# Patient Record
Sex: Male | Born: 1990 | Hispanic: No | Marital: Married | Smoking: Former smoker
Health system: Southern US, Community
[De-identification: ages and names within clinical notes are randomized; demographics above are authoritative.]

## PROBLEM LIST (undated history)

## (undated) ENCOUNTER — Emergency Department (HOSPITAL_COMMUNITY): Discharge status: Absent without leave | Payer: Self-pay

## (undated) DIAGNOSIS — K219 Gastro-esophageal reflux disease without esophagitis: Secondary | ICD-10-CM

## (undated) DIAGNOSIS — R569 Unspecified convulsions: Secondary | ICD-10-CM

## (undated) HISTORY — DX: Gastro-esophageal reflux disease without esophagitis: K21.9

## (undated) HISTORY — PX: TONSILLECTOMY: SUR1361

## (undated) HISTORY — DX: Unspecified convulsions: R56.9

---
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
## 2011-01-03 LAB — CBC
HCT: 47.9 % (ref 36.0–49.0)
Hemoglobin: 16.4 g/dL — ABNORMAL HIGH (ref 12.0–16.0)
MCHC: 34.2 g/dL (ref 31.0–37.0)
MCV: 87.7 fL (ref 78.0–98.0)
Platelets: 195 10*3/uL (ref 150–400)
RBC: 5.47 MIL/uL (ref 3.80–5.70)
RDW: 12.6 % (ref 11.4–15.5)
WBC: 16 10*3/uL — ABNORMAL HIGH (ref 4.5–13.5)

## 2011-01-03 LAB — DIFFERENTIAL
Basophils Absolute: 0 10*3/uL (ref 0.0–0.1)
Basophils Relative: 0 % (ref 0–1)
Eosinophils Absolute: 0 10*3/uL (ref 0.0–1.2)
Eosinophils Relative: 0 % (ref 0–5)
Lymphocytes Relative: 6 % — ABNORMAL LOW (ref 24–48)
Lymphs Abs: 1 10*3/uL — ABNORMAL LOW (ref 1.1–4.8)
Monocytes Absolute: 0.6 10*3/uL (ref 0.2–1.2)
Monocytes Relative: 4 % (ref 3–11)
Neutro Abs: 14.3 10*3/uL — ABNORMAL HIGH (ref 1.7–8.0)
Neutrophils Relative %: 90 % — ABNORMAL HIGH (ref 43–71)

## 2011-01-03 LAB — COMPREHENSIVE METABOLIC PANEL
ALT: 20 U/L (ref 0–53)
AST: 29 U/L (ref 0–37)
Albumin: 4.1 g/dL (ref 3.5–5.2)
Alkaline Phosphatase: 59 U/L (ref 52–171)
BUN: 11 mg/dL (ref 6–23)
CO2: 26 mEq/L (ref 19–32)
Calcium: 9.1 mg/dL (ref 8.4–10.5)
Chloride: 102 mEq/L (ref 96–112)
Creatinine, Ser: 1.09 mg/dL (ref 0.4–1.5)
Glucose, Bld: 150 mg/dL — ABNORMAL HIGH (ref 70–99)
Potassium: 4.5 mEq/L (ref 3.5–5.1)
Sodium: 137 mEq/L (ref 135–145)
Total Bilirubin: 1.7 mg/dL — ABNORMAL HIGH (ref 0.3–1.2)
Total Protein: 7.4 g/dL (ref 6.0–8.3)

## 2011-01-03 LAB — RAPID URINE DRUG SCREEN, HOSP PERFORMED
Amphetamines: NOT DETECTED
Barbiturates: NOT DETECTED
Benzodiazepines: NOT DETECTED
Cocaine: NOT DETECTED
Opiates: NOT DETECTED
Tetrahydrocannabinol: POSITIVE — AB

## 2011-07-10 LAB — COMPREHENSIVE METABOLIC PANEL
ALT: 20
Albumin: 4.4
BUN: 8
Chloride: 99
Potassium: 4

## 2011-07-10 LAB — DIFFERENTIAL
Basophils Absolute: 0
Eosinophils Relative: 0
Lymphocytes Relative: 6 — ABNORMAL LOW
Lymphs Abs: 0.9 — ABNORMAL LOW
Monocytes Absolute: 0.9
Monocytes Relative: 6
Neutro Abs: 13.1 — ABNORMAL HIGH
Neutrophils Relative %: 88 — ABNORMAL HIGH

## 2011-07-10 LAB — RAPID URINE DRUG SCREEN, HOSP PERFORMED
Amphetamines: NOT DETECTED
Barbiturates: NOT DETECTED
Benzodiazepines: NOT DETECTED
Cocaine: NOT DETECTED
Opiates: NOT DETECTED

## 2011-07-10 LAB — URINALYSIS, ROUTINE W REFLEX MICROSCOPIC
Nitrite: NEGATIVE
Specific Gravity, Urine: 1.021
pH: 6

## 2011-07-10 LAB — CBC: Platelets: 252

## 2011-07-10 LAB — URINE MICROSCOPIC-ADD ON

## 2011-07-16 IMAGING — CR DG CHEST 1V PORT
1 series · 1 of 1 positions shown · non-contrast
Comparison: None

CLINICAL DATA: Seizure today.  Seizure 2 years ago.

PORTABLE CHEST - 1 VIEW

[AP]
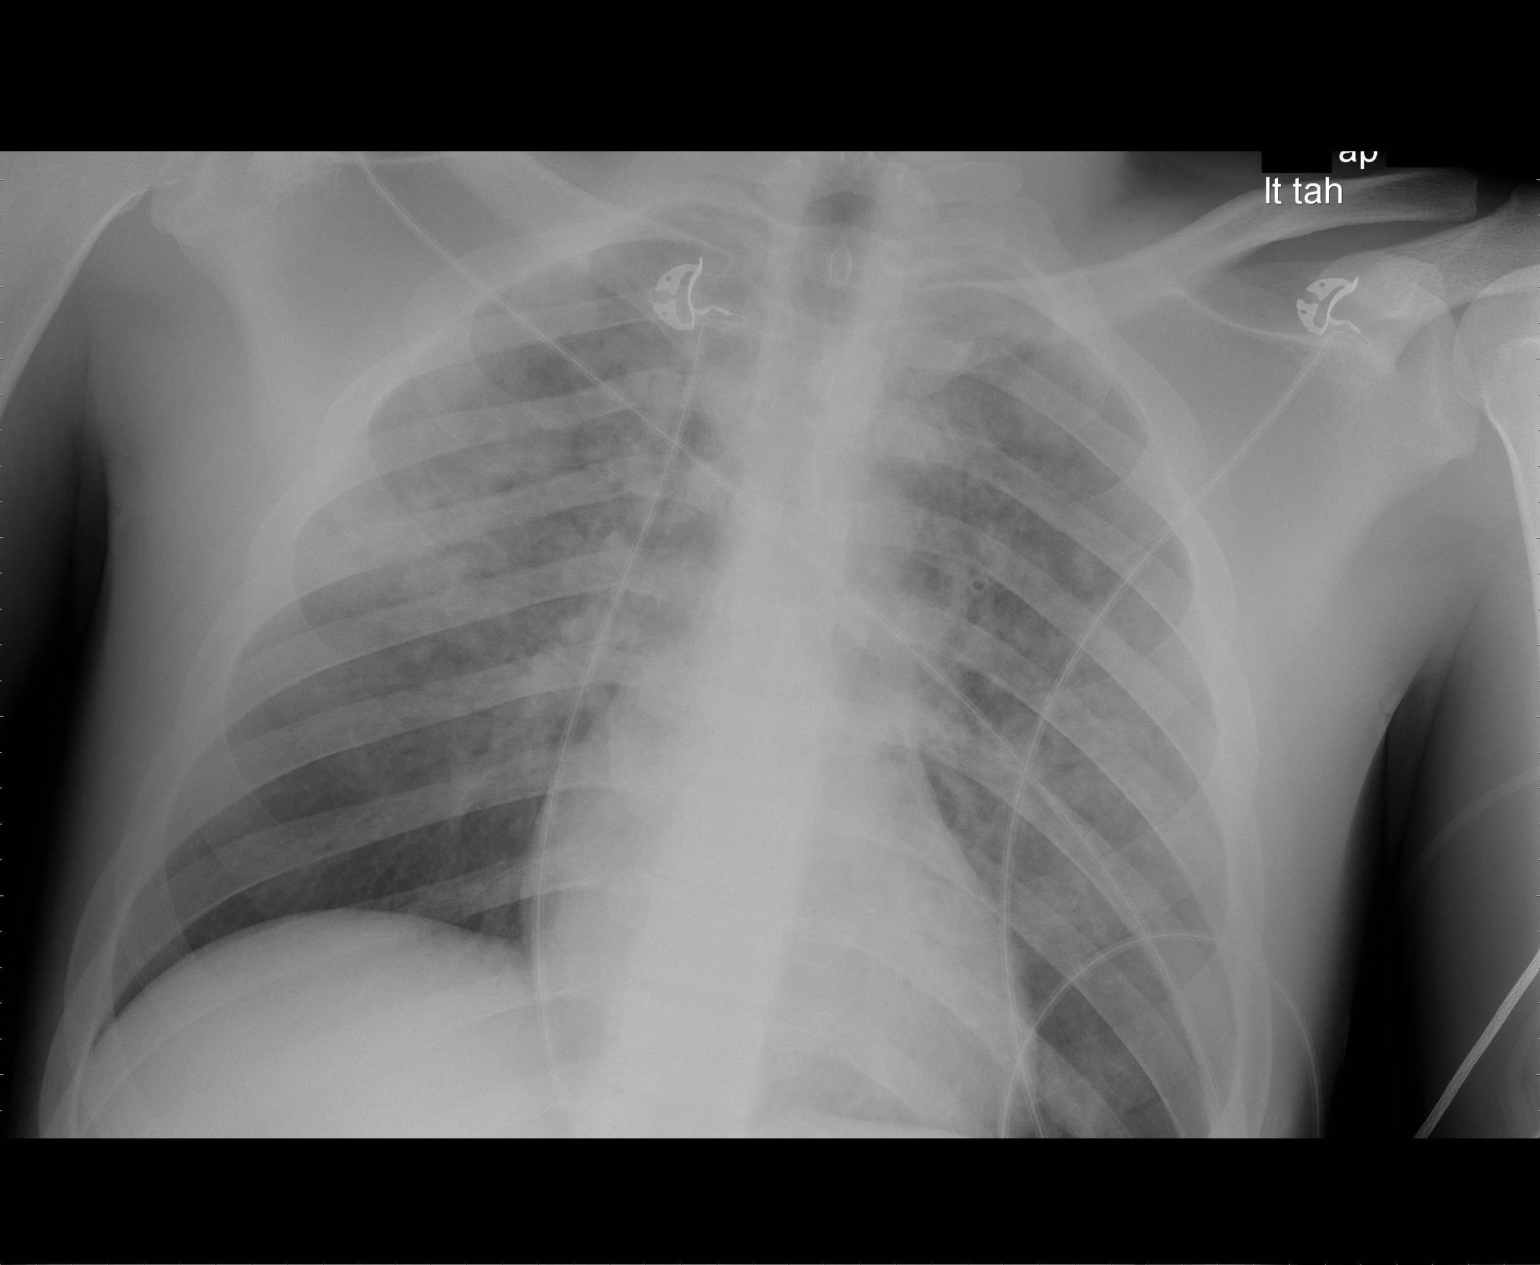

[1 of 1 positions shown; findings below may reference images not displayed]

FINDINGS: Heart size is normal.  There are patchy infiltrates
within the upper lobes bilaterally.  The findings are consistent
with aspiration pneumonia versus community-acquired pneumonia or
noncardiogenic edema.  The findings are atypical for cardiogenic
pulmonary edema.  No evidence for pleural effusion.
IMPRESSION: Bilateral upper lobe infiltrates.

## 2011-07-16 IMAGING — CT CT HEAD W/O CM
1 of 2 series · 14 of 30 positions shown, 18 images · non-contrast
Comparison: None

CLINICAL DATA: Seizure

CT HEAD WITHOUT CONTRAST
TECHNIQUE: Contiguous axial images were obtained from the base of
the skull through the vertex without contrast.

[Series 2: head routine 4.8 h37s · axial · 0.43mm/px · z∈[+1264,+1411]mm · 14 of 36 slices shown, 18 images]
[im 3/36  brain]
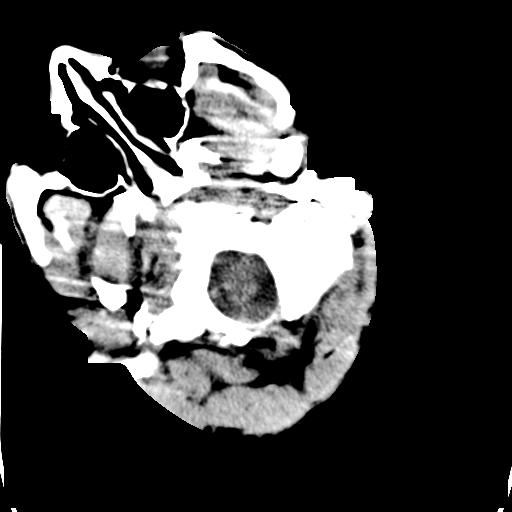
[im 3/36  bone]
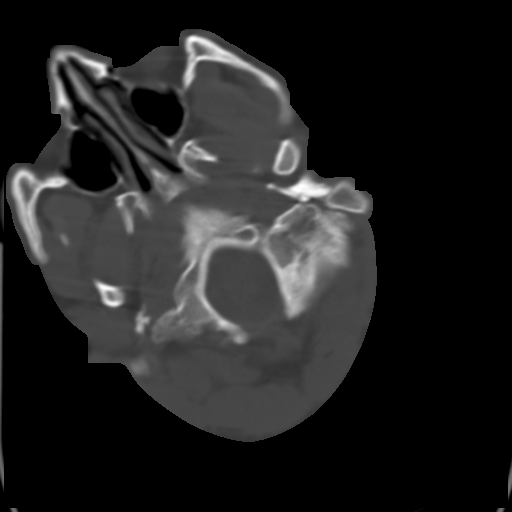
[im 5/36  brain]
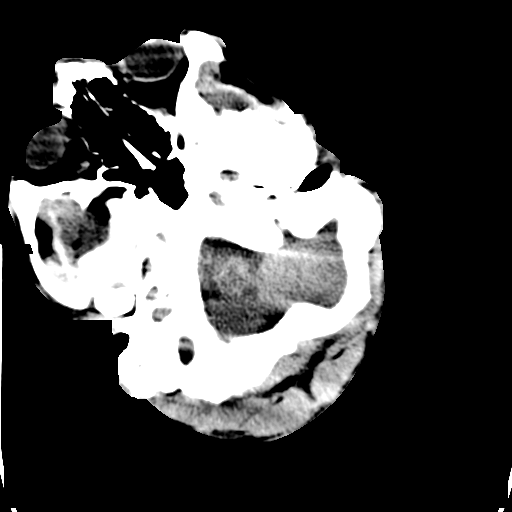
[im 8/36  brain]
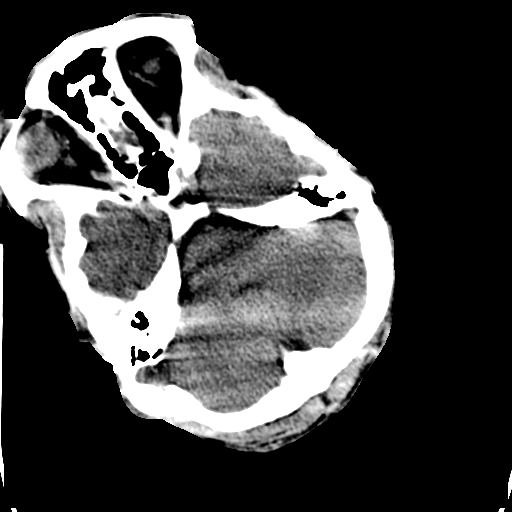
[im 10/36  brain]
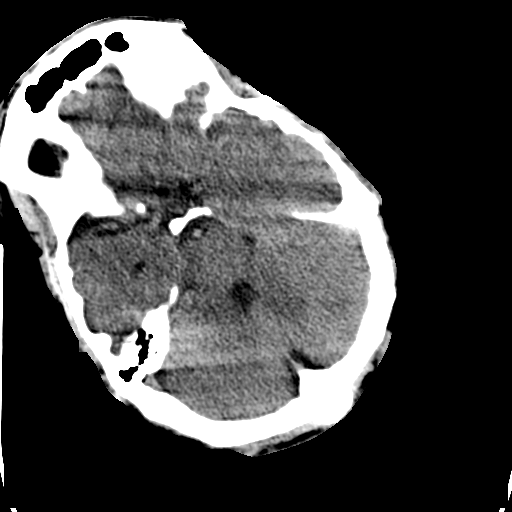
[im 12/36  brain]
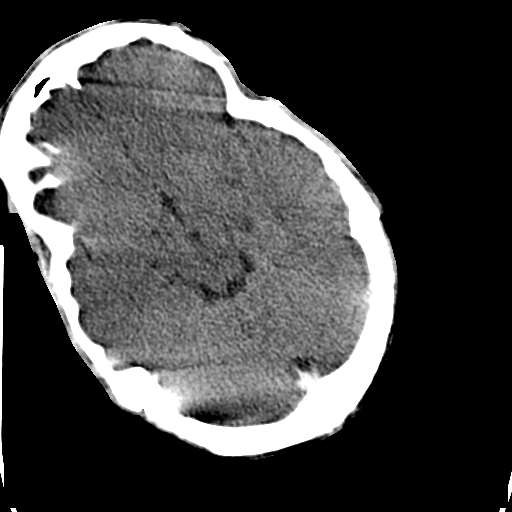
[im 12/36  bone]
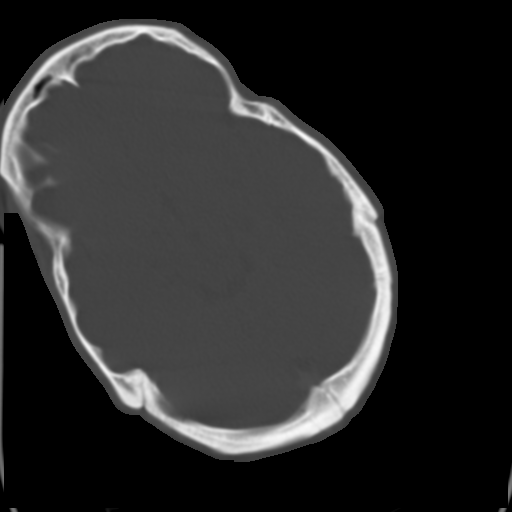
[im 15/36  brain]
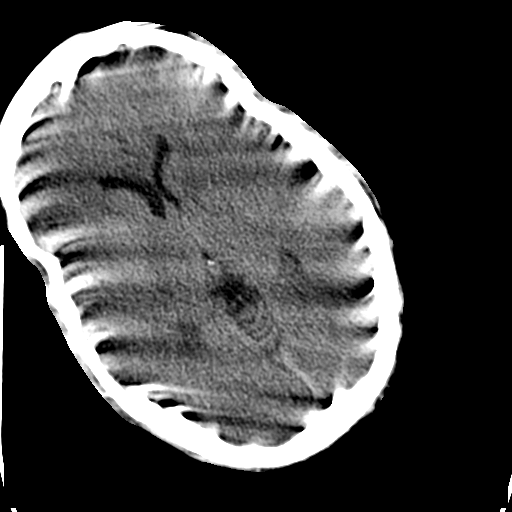
[im 17/36  brain]
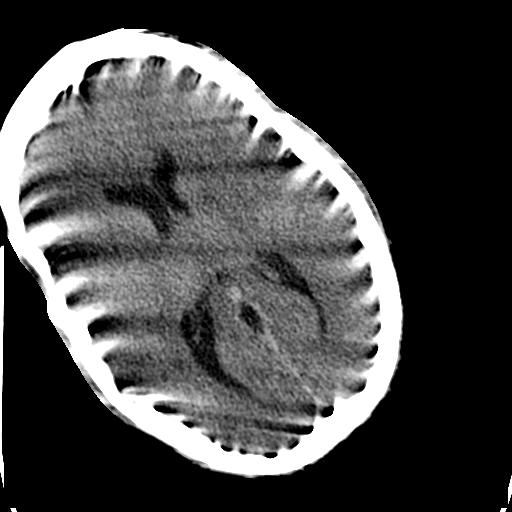
[im 19/36  brain]
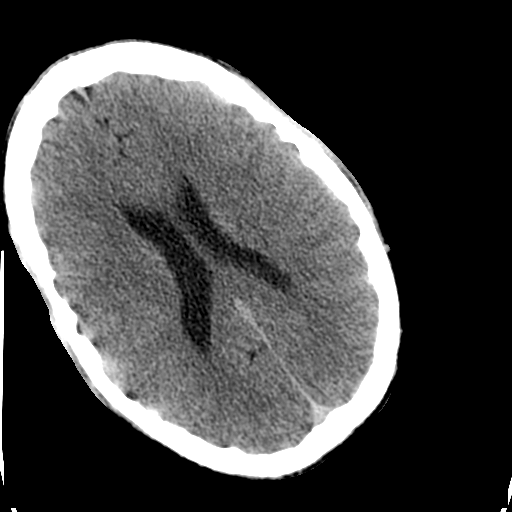
[im 22/36  brain]
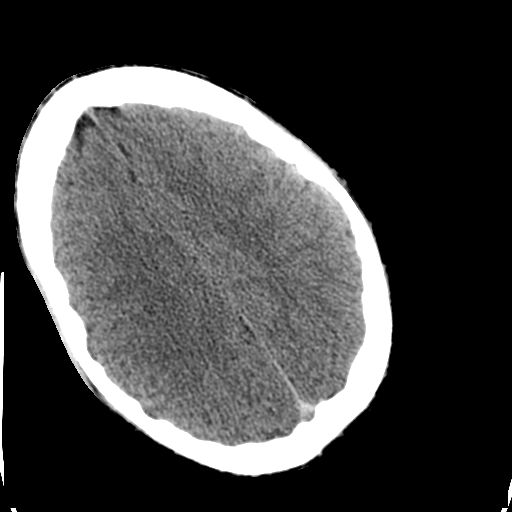
[im 22/36  bone]
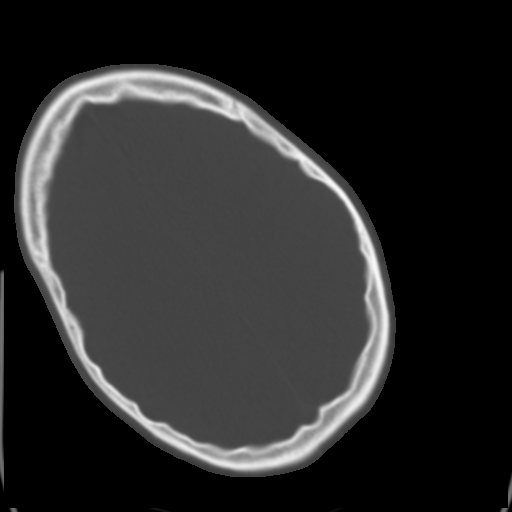
[im 24/36  brain]
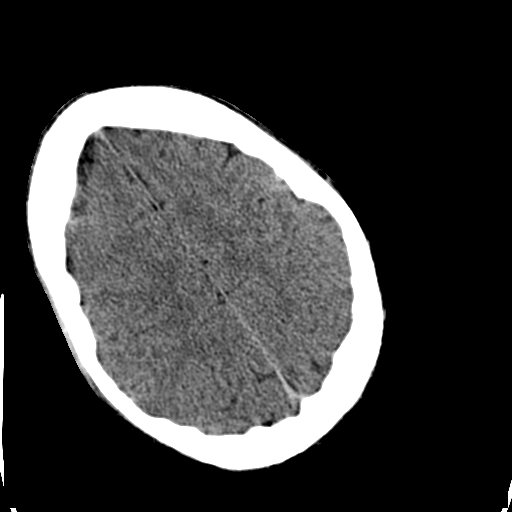
[im 26/36  brain]
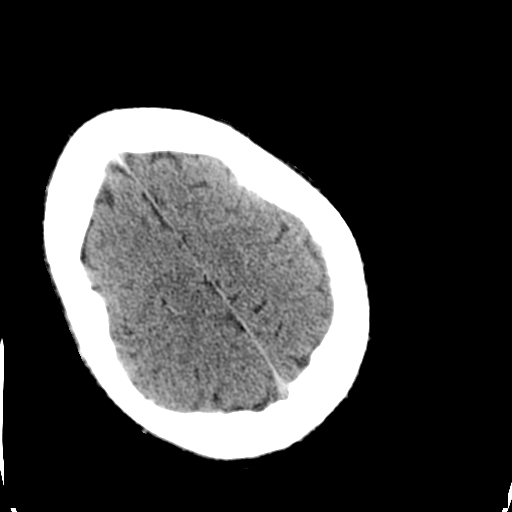
[im 29/36  brain]
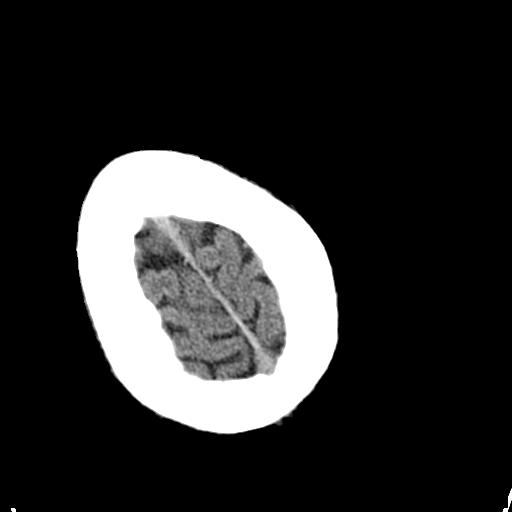
[im 31/36  brain]
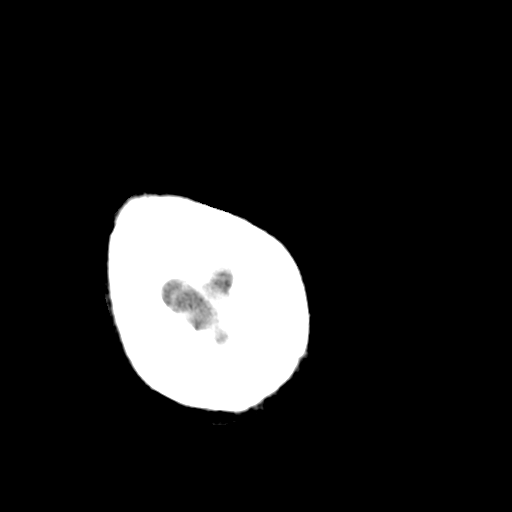
[im 31/36  bone]
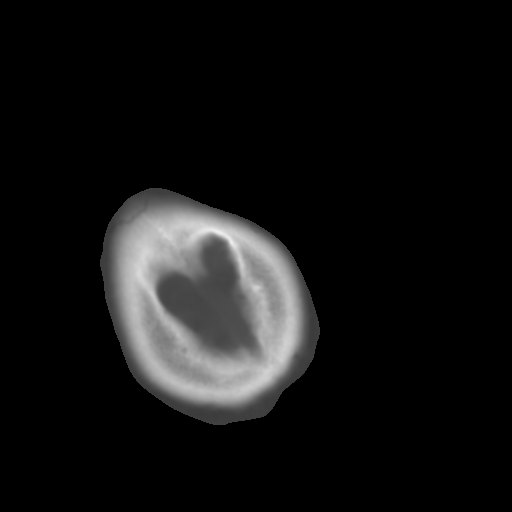
[im 33/36  brain]
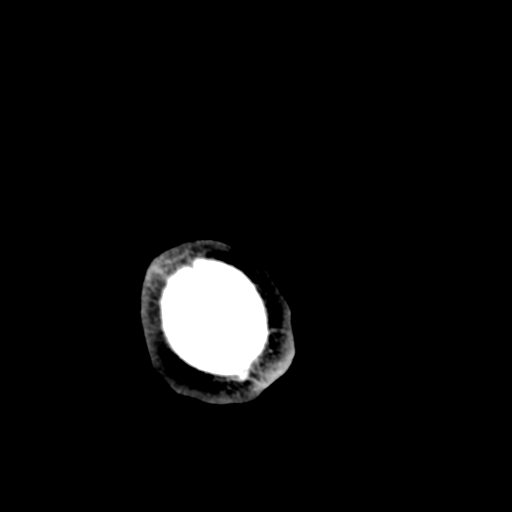

[14 of 30 positions shown; findings below may reference images not displayed]

FINDINGS: There is motion artifact that required repeating numerous
slices.

Ventricular size and CSF spaces unremarkable.  No evidence for
acute infarct, hemorrhage, or mass lesion. No extra-axial fluid
collections or midline shift.  Calvarium intact.  No fluid in the
sinuses visualized.
IMPRESSION: The exam is limited technically by motion artifact - there appears
to be no obvious acute or focal intracranial abnormality.

## 2012-01-24 DIAGNOSIS — G40209 Localization-related (focal) (partial) symptomatic epilepsy and epileptic syndromes with complex partial seizures, not intractable, without status epilepticus: Secondary | ICD-10-CM | POA: Insufficient documentation

## 2012-01-29 ENCOUNTER — Ambulatory Visit (INDEPENDENT_AMBULATORY_CARE_PROVIDER_SITE_OTHER): Payer: Self-pay | Admitting: Family Medicine

## 2012-01-29 ENCOUNTER — Encounter: Payer: Self-pay | Admitting: Family Medicine

## 2012-01-29 VITALS — BP 116/70 | HR 86 | Temp 98.3°F | Wt 191.0 lb

## 2012-01-29 DIAGNOSIS — S838X9A Sprain of other specified parts of unspecified knee, initial encounter: Secondary | ICD-10-CM

## 2012-01-29 DIAGNOSIS — S0031XA Abrasion of nose, initial encounter: Secondary | ICD-10-CM

## 2012-01-29 DIAGNOSIS — S86919A Strain of unspecified muscle(s) and tendon(s) at lower leg level, unspecified leg, initial encounter: Secondary | ICD-10-CM

## 2012-01-29 DIAGNOSIS — IMO0002 Reserved for concepts with insufficient information to code with codable children: Secondary | ICD-10-CM

## 2012-01-29 NOTE — Progress Notes (Signed)
  Subjective:    Patient ID: Joseph Cameron, male    DOB: 1991-05-18, 21 y.o.   MRN: [id number]  HPI Here to check his face after he fell 3 days ago on a paved street while playing football. He scraped the bridge of his nose, and he has been dressing it with Neosporin and a Bandaid daily since then. There is little pain now, and he can breathe through the nose easily. He also asks me to check his left knee. About a month ago he as playing basketball when another player ran into him and struck the knee from the side. It swelled and was painful for a few days after that, but then the swelling resolved. The pain is much better now, although it hurts if he flexes the knee completely, such as when squatting down. No locking or giving way.    Review of Systems  Constitutional: Negative.        Objective:   Physical Exam  Constitutional: He appears well-developed and well-nourished.  HENT:  Head: Normocephalic.  Right Ear: External ear normal.  Left Ear: External ear normal.  Mouth/Throat: Oropharynx is clear and moist.       The bridge of the nose has an abrasion that already has some granulation tissue coming in at the edges. No fractures are apparent  Eyes: Conjunctivae and EOM are normal. Pupils are equal, round, and reactive to light.  Neck: Normal range of motion. Neck supple.  Musculoskeletal:       The left knee has full ROM although he has some pain on full flexion. No edema. No tenderness or crepitus          Assessment & Plan:  The nose should heal just fine over the next few weeks. Use Neosporin daily. The knee may have a tiny tear in a meniscus, but for now we decided to take it easy and monitor it only. If he continues to have pain in the next few weeks, we may refer to Orthopedics. He will avoid playing basketball, running, or jumping on it.

## 2012-01-30 ENCOUNTER — Encounter: Payer: Self-pay | Admitting: Family Medicine

## 2012-05-20 ENCOUNTER — Telehealth: Payer: Self-pay | Admitting: Internal Medicine

## 2012-05-20 DIAGNOSIS — S0031XA Abrasion of nose, initial encounter: Secondary | ICD-10-CM

## 2012-05-20 NOTE — Telephone Encounter (Signed)
Called and spoke with pt - concerns about abrasion and how it is healing - nose injury while playing footbal - D.r Clent Ridges saw Please advise

## 2012-05-20 NOTE — Telephone Encounter (Signed)
Patient called stating that he would like to be referred to a Dermatologist for his nose injury. Please advise.

## 2012-05-21 NOTE — Telephone Encounter (Signed)
Ok to refer.

## 2012-07-17 ENCOUNTER — Encounter: Payer: Self-pay | Admitting: Internal Medicine

## 2012-07-17 ENCOUNTER — Ambulatory Visit (INDEPENDENT_AMBULATORY_CARE_PROVIDER_SITE_OTHER): Payer: BC Managed Care – PPO | Admitting: Internal Medicine

## 2012-07-17 VITALS — BP 96/60 | Wt 196.0 lb

## 2012-07-17 DIAGNOSIS — Z23 Encounter for immunization: Secondary | ICD-10-CM

## 2012-07-17 DIAGNOSIS — S93409A Sprain of unspecified ligament of unspecified ankle, initial encounter: Secondary | ICD-10-CM

## 2012-07-17 DIAGNOSIS — S93402A Sprain of unspecified ligament of left ankle, initial encounter: Secondary | ICD-10-CM

## 2012-07-17 NOTE — Patient Instructions (Signed)
You  may move around, but avoid painful motions and activities.  Apply ice to the sore area for 15 to 20 minutes 3 or 4 times daily for the next two to 3 days.  Call or return to clinic prn if these symptoms worsen or fail to improve as anticipated.  

## 2012-07-17 NOTE — Progress Notes (Signed)
  Subjective:    Patient ID: Joseph Cameron, male    DOB: [birth year], 21 y.o.   MRN: [id number]  HPI  21 year old patient who sustained a sprain to his left lateral ankle while playing basketball yesterday. He has persistent pain and soft tissue swelling and has had a very difficult time with ambulation due to the pain.  Past Medical History  Diagnosis Date  . Seizures     History   Social History  . Marital Status: Single    Spouse Name: N/A    Number of Children: N/A  . Years of Education: N/A   Occupational History  . Not on file.   Social History Main Topics  . Smoking status: Never Smoker   . Smokeless tobacco: Never Used  . Alcohol Use: 0.5 oz/week    1 drink(s) per week  . Drug Use: No  . Sexually Active: Not on file   Other Topics Concern  . Not on file   Social History Narrative  . No narrative on file    Past Surgical History  Procedure Date  . Tonsillectomy     No family history on file.  No Known Allergies  Current Outpatient Prescriptions on File Prior to Visit  Medication Sig Dispense Refill  . lamoTRIgine (LAMICTAL) 200 MG tablet Take 200 mg by mouth daily.      Marland Kitchen lisdexamfetamine (VYVANSE) 70 MG capsule Take 70 mg by mouth every morning.        BP 96/60  Wt 196 lb (88.905 kg)       Review of Systems  Constitutional: Negative for fever, chills, appetite change and fatigue.  HENT: Negative for hearing loss, ear pain, congestion, sore throat, trouble swallowing, neck stiffness, dental problem, voice change and tinnitus.   Eyes: Negative for pain, discharge and visual disturbance.  Respiratory: Negative for cough, chest tightness, wheezing and stridor.   Cardiovascular: Negative for chest pain, palpitations and leg swelling.  Gastrointestinal: Negative for nausea, vomiting, abdominal pain, diarrhea, constipation, blood in stool and abdominal distention.  Genitourinary: Negative for urgency, hematuria, flank pain, discharge, difficulty  urinating and genital sores.  Musculoskeletal: Positive for joint swelling and gait problem. Negative for myalgias, back pain and arthralgias.  Skin: Negative for rash.  Neurological: Negative for dizziness, syncope, speech difficulty, weakness, numbness and headaches.  Hematological: Negative for adenopathy. Does not bruise/bleed easily.  Psychiatric/Behavioral: Negative for behavioral problems and dysphoric mood. The patient is not nervous/anxious.        Objective:   Physical Exam  Constitutional: He appears well-developed and well-nourished. No distress.  Musculoskeletal:       Considerable tenderness soft tissue swelling involving his left lateral ankle          Assessment & Plan:  Left lateral ankle strain. Will attempt to elevate and ice over the next 24 hours and gradually increase his limits to ambulation. Aleve twice daily recommended note for school dispense

## 2012-08-05 ENCOUNTER — Ambulatory Visit (INDEPENDENT_AMBULATORY_CARE_PROVIDER_SITE_OTHER): Payer: BC Managed Care – PPO | Admitting: Internal Medicine

## 2012-08-05 VITALS — BP 120/64 | HR 56 | Temp 98.0°F | Wt 205.0 lb

## 2012-08-05 DIAGNOSIS — K1379 Other lesions of oral mucosa: Secondary | ICD-10-CM | POA: Insufficient documentation

## 2012-08-05 DIAGNOSIS — R569 Unspecified convulsions: Secondary | ICD-10-CM

## 2012-08-05 DIAGNOSIS — J029 Acute pharyngitis, unspecified: Secondary | ICD-10-CM

## 2012-08-05 MED ORDER — CEFUROXIME AXETIL 500 MG PO TABS: 500.0000 mg | ORAL_TABLET | Freq: Two times a day (BID) | ORAL | Status: DC

## 2012-08-05 MED ORDER — CEFTRIAXONE SODIUM 250 MG IJ SOLR
250.0000 mg | Freq: Once | INTRAMUSCULAR | Status: AC
  Administered 2012-08-05: 250 mg via INTRAMUSCULAR

## 2012-08-05 NOTE — Progress Notes (Signed)
  Subjective:    Patient ID: Joseph Cameron, male    DOB: November 11, 1990, 21 y.o.   MRN: [id number]  HPI  21 year old male with history of seizure disorder complains of spitting up blood 2 or 3 times over the past one week. Patient reports seeing dark red blood. He has had a sore throat for the last 2 weeks. He also has right ear discomfort. He denies fever or chills. He denies any cough or shortness of breath. He denies any abdominal discomfort. There is no report of nausea or vomiting. He denies epistaxis.  Review of Systems See HPI    Past Medical History  Diagnosis Date  . Seizures     History   Social History  . Marital Status: Single    Spouse Name: N/A    Number of Children: N/A  . Years of Education: N/A   Occupational History  . Not on file.   Social History Main Topics  . Smoking status: Never Smoker   . Smokeless tobacco: Never Used  . Alcohol Use: 0.5 oz/week    1 drink(s) per week  . Drug Use: No  . Sexually Active: Not on file   Other Topics Concern  . Not on file   Social History Narrative  . No narrative on file    Past Surgical History  Procedure Date  . Tonsillectomy     No family history on file.  No Known Allergies  Current Outpatient Prescriptions on File Prior to Visit  Medication Sig Dispense Refill  . lamoTRIgine (LAMICTAL) 200 MG tablet Take 200 mg by mouth daily.      Marland Kitchen lisdexamfetamine (VYVANSE) 70 MG capsule Take 70 mg by mouth every morning.        BP 120/64  Pulse 56  Temp 98 F (36.7 C) (Oral)  Wt 205 lb (92.987 kg)  SpO2 97%    Objective:   Physical Exam  Constitutional: He is oriented to person, place, and time. He appears well-developed and well-nourished.  HENT:  Head: Normocephalic and atraumatic.       Right tympanic membrane dull, erythematous and retracted Significant oropharyngeal erythema and tonsillar swelling Nasal turbinates are normal in appearance  Eyes: EOM are normal. Pupils are equal, round, and  reactive to light.  Neck: Neck supple.       Right anterior cervical adenopathy Mild neck tenderness  Cardiovascular: Normal rate, regular rhythm and normal heart sounds.   No murmur heard. Pulmonary/Chest: Effort normal and breath sounds normal. He has no wheezes. He has no rales.  Neurological: He is alert and oriented to person, place, and time. No cranial nerve deficit.  Psychiatric: He has a normal mood and affect.          Assessment & Plan:

## 2012-08-05 NOTE — Assessment & Plan Note (Signed)
21 year old has been spitting up blood to 3 times over the last one week. I suspect his symptoms are related to acute pharyngitis/tonsillitis. He may also have right otitis media. Patient given Rocephin 250 mg IM x1. Patient also advised to take 500 mg of cefuroxime twice daily for 10 days. Gargle with warm salt water 2-3 times a day. Follow up with Dr. Amador Cunas in 2 weeks.  Patient advised to call office if symptoms persist or worsen.

## 2012-08-05 NOTE — Assessment & Plan Note (Signed)
Stable on lamictal.  Patient's mother requests we monitor labs.  Arrange CBCD, LFTs and BMET.

## 2012-08-05 NOTE — Patient Instructions (Addendum)
Gargle with warm salt water 2-3 times per day Use salt water (saline) nose spray 3 - 4 times per day. Please call our office if your symptoms do not improve or gets worse. Follow up with Dr. Amador Cunas within 2 weeks Please complete the following lab test within 1 week BMET, CBCD, LFTs - 780.39

## 2013-02-28 ENCOUNTER — Telehealth: Payer: Self-pay | Admitting: *Deleted

## 2013-02-28 ENCOUNTER — Other Ambulatory Visit: Payer: Self-pay | Admitting: Internal Medicine

## 2013-02-28 DIAGNOSIS — G40219 Localization-related (focal) (partial) symptomatic epilepsy and epileptic syndromes with complex partial seizures, intractable, without status epilepticus: Secondary | ICD-10-CM

## 2013-02-28 NOTE — Telephone Encounter (Signed)
Left message on voicemail to call office. Needs to schedule  Lab appointment for blood work ordered by [employer].

## 2013-03-05 NOTE — Telephone Encounter (Signed)
Called pt he was not available his mother said but she would schedule appt for lab work. Transferred to scheduling.

## 2013-03-06 ENCOUNTER — Other Ambulatory Visit (INDEPENDENT_AMBULATORY_CARE_PROVIDER_SITE_OTHER): Payer: BC Managed Care – PPO

## 2013-03-06 DIAGNOSIS — G40219 Localization-related (focal) (partial) symptomatic epilepsy and epileptic syndromes with complex partial seizures, intractable, without status epilepticus: Secondary | ICD-10-CM

## 2013-03-07 ENCOUNTER — Other Ambulatory Visit: Payer: Self-pay | Admitting: Internal Medicine

## 2013-03-07 NOTE — Progress Notes (Signed)
Labs from 03/06/2013 faxed to [facility] Dr. Sherlean Foot as requested.

## 2013-04-07 ENCOUNTER — Encounter: Payer: Self-pay | Admitting: Internal Medicine

## 2013-04-07 ENCOUNTER — Ambulatory Visit (INDEPENDENT_AMBULATORY_CARE_PROVIDER_SITE_OTHER): Payer: BC Managed Care – PPO | Admitting: Internal Medicine

## 2013-04-07 VITALS — BP 120/72 | HR 69 | Temp 98.3°F | Wt 202.0 lb

## 2013-04-07 DIAGNOSIS — J039 Acute tonsillitis, unspecified: Secondary | ICD-10-CM | POA: Insufficient documentation

## 2013-04-07 DIAGNOSIS — J029 Acute pharyngitis, unspecified: Secondary | ICD-10-CM

## 2013-04-07 DIAGNOSIS — J020 Streptococcal pharyngitis: Secondary | ICD-10-CM

## 2013-04-07 HISTORY — DX: Acute tonsillitis, unspecified: J03.90

## 2013-04-07 LAB — POCT RAPID STREP A (OFFICE): Rapid Strep A Screen: NEGATIVE

## 2013-04-07 MED ORDER — CEPHALEXIN 500 MG PO CAPS: 500.0000 mg | ORAL_CAPSULE | Freq: Three times a day (TID) | ORAL | Status: DC

## 2013-04-07 NOTE — Progress Notes (Signed)
Chief Complaint  Patient presents with  . Sore Throat    Ongoing for 1 week.  . Cough  . Nasal Congestion  . Otalgia    HPI: Patient comes in  today for SDA for  new problem evaluation. Onset over a week ago.   No fever.  Has had sore throat to swallow and now  ear aches . 2 days ago of ear pain . Left .   No fever but dec appetite    Neg exposures . Works in [employer]  Had to leave work early today having headaches .   Uses smoking ocass ocass.  Cough not bad more related to  Drainage ? ROS: See pertinent positives and negatives per HPI. No new rash VD  On vyvanse ofr add .   No hx of mono  Had hx of r sided throat infection in fall  And rx w/ antibiotic   Past Medical History  Diagnosis Date  . Seizures     No family history on file.  History   Social History  . Marital Status: Single    Spouse Name: N/A    Number of Children: N/A  . Years of Education: N/A   Social History Main Topics  . Smoking status: Never Smoker   . Smokeless tobacco: Never Used  . Alcohol Use: 0.5 oz/week    1 drink(s) per week  . Drug Use: No  . Sexually Active: None   Other Topics Concern  . None   Social History Narrative  . None    Outpatient Encounter Prescriptions as of 04/07/2013  Medication Sig Dispense Refill  . lamoTRIgine (LAMICTAL) 200 MG tablet Take 200 mg by mouth daily.      Marland Kitchen lisdexamfetamine (VYVANSE) 70 MG capsule Take 70 mg by mouth every morning.      . cephALEXin (KEFLEX) 500 MG capsule Take 1 capsule (500 mg total) by mouth 3 (three) times daily.  30 capsule  0  . [DISCONTINUED] cefUROXime (CEFTIN) 500 MG tablet Take 1 tablet (500 mg total) by mouth 2 (two) times daily.  20 tablet  0   No facility-administered encounter medications on file as of 04/07/2013.    EXAM:  BP 120/72  Pulse 69  Temp(Src) 98.3 F (36.8 C) (Oral)  Wt 202 lb (91.627 kg)  BMI 25.6 kg/m2  SpO2 98%  Body mass index is 25.6 kg/(m^2).  GENERAL: vitals reviewed and listed above,  alert, oriented, appears well hydrated and in no acute distress sounds congestd throat  Full  No drooling and resp normal   HEENT: atraumatic, conjunctiva  clear, no obvious abnormalities on inspection of external nose and ears old scar on bridge of nose    tms intact  No acute findings translucent    Nares sounds congested no dc and no face tenderness  OP : patent  Tonsil 2 + early exudate  Ok airway no palatal petechia  Red 2 +  NECK: supple   Tender 1 + ac nodes  Tender  Shoddy pc nodes .  LUNGS: clear to auscultation bilaterally, no wheezes, rales or rhonchi, good air movement CV: HRRR, no clubbing cyanosis or  peripheral edema nl cap refill  Abdomen:  Sof,t normal bowel sounds without hepatosplenomegaly, no guarding rebound or masses no CVA tenderness MS: moves all extremities without noticeable focal  abnormality PSYCH: pleasant and cooperative, no obvious depression or anxiety  ASSESSMENT AND PLAN:  Discussed the following assessment and plan:  Acute tonsillitis - Plan: POCT Mono Gwyneth Sprout  Barr Virus)  rule out strep throat - Plan: POC Rapid Strep A  Sore throat - Plan: Throat culture Loney Loh), POCT Mono (Epstein Barr Virus) stil consdier mono with presentation  Vs other  Culture pending  Keflex 500 tid  Note for work fluids  Rests   Fu if not a lot better   In a few days or pen.  iof  persistent or progressive consider getting cbc serologies etc .  -Patient advised to return or notify health care team  if symptoms worsen or persist or new concerns arise.  Patient Instructions   Add antibiotic in case this is a bacterial tonsillitis. pending the results of the throat culture .  If  persistent or progressive   We can do more evaluation.  Expect improvement in the next 48 - 72 hours   Tonsillitis Tonsils are lumps of lymphoid tissues at the back of the throat. Each tonsil has 20 crevices (crypts). Tonsils help fight nose and throat infections and keep infection from spreading to  other parts of the body for the first 18 months of life. Tonsillitis is an infection of the throat that causes the tonsils to become red, tender, and swollen. CAUSES Sudden and, if treated, temporary (acute) tonsillitis is usually caused by infection with streptococcal bacteria. Long lasting (chronic) tonsillitis occurs when the crypts of the tonsils become filled with pieces of food and bacteria, which makes it easy for the tonsils to become constantly infected. SYMPTOMS  Symptoms of tonsillitis include:  A sore throat.  White patches on the tonsils.  Fever.  Tiredness. DIAGNOSIS Tonsillitis can be diagnosed through a physical exam. Diagnosis can be confirmed with the results of lab tests, including a throat culture. TREATMENT  The goals of tonsillitis treatment include the reduction of the severity and duration of symptoms, prevention of associated conditions, and prevention of disease transmission. Tonsillitis caused by bacteria can be treated with antibiotics. Usually, treatment with antibiotics is started before the cause of the tonsillitis is known. However, if it is determined that the cause is not bacterial, antibiotics will not treat the tonsillitis. If attacks of tonsillitis are severe and frequent, your caregiver may recommend surgery to remove the tonsils (tonsillectomy). HOME CARE INSTRUCTIONS   Rest as much as possible and get plenty of sleep.  Drink plenty of fluids. While the throat is very sore, eat soft foods or liquids, such as sherbet, soups, or instant breakfast drinks.  Eat frozen ice pops.  Older children and adults may gargle with a warm or cold liquid to help soothe the throat. Mix 1 teaspoon of salt in 1 cup of water.  Other family members who also develop a sore throat or fever should have a medical exam or throat culture.  Only take over-the-counter or prescription medicines for pain, discomfort, or fever as directed by your caregiver.  If you are given  antibiotics, take them as directed. Finish them even if you start to feel better. SEEK MEDICAL CARE IF:   Your baby is older than 3 months with a rectal temperature of 100.5 F (38.1 C) or higher for more than 1 day.  Large, tender lumps develop in your neck.  A rash develops.  Green, yellow-brown, or bloody substance is coughed up.  You are unable to swallow liquids or food for 24 hours.  Your child is unable to swallow food or liquids for 12 hours. SEEK IMMEDIATE MEDICAL CARE IF:   You develop any new symptoms such as vomiting, severe headache, stiff  neck, chest pain, or trouble breathing or swallowing.  You have severe throat pain along with drooling or voice changes.  You have severe pain, unrelieved with recommended medications.  You are unable to fully open the mouth.  You develop redness, swelling, or severe pain anywhere in the neck.  You have a fever.  Your baby is older than 3 months with a rectal temperature of 102 F (38.9 C) or higher.  Your baby is 52 months old or younger with a rectal temperature of 100.4 F (38 C) or higher. MAKE SURE YOU:   Understand these instructions.  Will watch your condition.  Will get help right away if you are not doing well or get worse. Document Released: 07/12/2005 Document Revised: 12/25/2011 Document Reviewed: 12/08/2010 [facility] [place] Patient Information 2014 [place], [place].      Neta Mends. Panosh M.D.

## 2013-04-07 NOTE — Patient Instructions (Addendum)
Add antibiotic in case this is a bacterial tonsillitis. pending the results of the throat culture .  If  persistent or progressive   We can do more evaluation.  Expect improvement in the next 48 - 72 hours   Tonsillitis Tonsils are lumps of lymphoid tissues at the back of the throat. Each tonsil has 20 crevices (crypts). Tonsils help fight nose and throat infections and keep infection from spreading to other parts of the body for the first 18 months of life. Tonsillitis is an infection of the throat that causes the tonsils to become red, tender, and swollen. CAUSES Sudden and, if treated, temporary (acute) tonsillitis is usually caused by infection with streptococcal bacteria. Long lasting (chronic) tonsillitis occurs when the crypts of the tonsils become filled with pieces of food and bacteria, which makes it easy for the tonsils to become constantly infected. SYMPTOMS  Symptoms of tonsillitis include:  A sore throat.  White patches on the tonsils.  Fever.  Tiredness. DIAGNOSIS Tonsillitis can be diagnosed through a physical exam. Diagnosis can be confirmed with the results of lab tests, including a throat culture. TREATMENT  The goals of tonsillitis treatment include the reduction of the severity and duration of symptoms, prevention of associated conditions, and prevention of disease transmission. Tonsillitis caused by bacteria can be treated with antibiotics. Usually, treatment with antibiotics is started before the cause of the tonsillitis is known. However, if it is determined that the cause is not bacterial, antibiotics will not treat the tonsillitis. If attacks of tonsillitis are severe and frequent, your caregiver may recommend surgery to remove the tonsils (tonsillectomy). HOME CARE INSTRUCTIONS   Rest as much as possible and get plenty of sleep.  Drink plenty of fluids. While the throat is very sore, eat soft foods or liquids, such as sherbet, soups, or instant breakfast  drinks.  Eat frozen ice pops.  Older children and adults may gargle with a warm or cold liquid to help soothe the throat. Mix 1 teaspoon of salt in 1 cup of water.  Other family members who also develop a sore throat or fever should have a medical exam or throat culture.  Only take over-the-counter or prescription medicines for pain, discomfort, or fever as directed by your caregiver.  If you are given antibiotics, take them as directed. Finish them even if you start to feel better. SEEK MEDICAL CARE IF:   Your baby is older than 3 months with a rectal temperature of 100.5 F (38.1 C) or higher for more than 1 day.  Large, tender lumps develop in your neck.  A rash develops.  Green, yellow-brown, or bloody substance is coughed up.  You are unable to swallow liquids or food for 24 hours.  Your child is unable to swallow food or liquids for 12 hours. SEEK IMMEDIATE MEDICAL CARE IF:   You develop any new symptoms such as vomiting, severe headache, stiff neck, chest pain, or trouble breathing or swallowing.  You have severe throat pain along with drooling or voice changes.  You have severe pain, unrelieved with recommended medications.  You are unable to fully open the mouth.  You develop redness, swelling, or severe pain anywhere in the neck.  You have a fever.  Your baby is older than 3 months with a rectal temperature of 102 F (38.9 C) or higher.  Your baby is 66 months old or younger with a rectal temperature of 100.4 F (38 C) or higher. MAKE SURE YOU:   Understand these instructions.  Will watch your condition.  Will get help right away if you are not doing well or get worse. Document Released: 07/12/2005 Document Revised: 12/25/2011 Document Reviewed: 12/08/2010 [facility] Patient Information 2014 [place], [place].

## 2013-04-09 LAB — CULTURE, GROUP A STREP

## 2014-10-13 ENCOUNTER — Telehealth: Payer: Self-pay | Admitting: Internal Medicine

## 2014-10-13 NOTE — Telephone Encounter (Signed)
Pt LMOVM for [facility] requesting injection records. I have printed them for pick up. He needs to sign a release when he picks them up. Unable to LM for patient b/c his VM is not set up.

## 2016-03-30 DIAGNOSIS — F909 Attention-deficit hyperactivity disorder, unspecified type: Secondary | ICD-10-CM | POA: Insufficient documentation

## 2019-12-21 ENCOUNTER — Ambulatory Visit: Payer: Self-pay

## 2022-07-31 ENCOUNTER — Emergency Department (HOSPITAL_COMMUNITY): Payer: 59

## 2022-07-31 ENCOUNTER — Encounter (HOSPITAL_COMMUNITY): Payer: Self-pay

## 2022-07-31 ENCOUNTER — Other Ambulatory Visit: Payer: Self-pay

## 2022-07-31 ENCOUNTER — Emergency Department (HOSPITAL_COMMUNITY)
Admission: EM | Admit: 2022-07-31 | Discharge: 2022-08-01 | Disposition: A | Discharge status: Home / Self Care | Payer: 59 | Attending: Physician Assistant | Admitting: Physician Assistant

## 2022-07-31 DIAGNOSIS — R569 Unspecified convulsions: Secondary | ICD-10-CM

## 2022-07-31 LAB — BASIC METABOLIC PANEL
Anion gap: 10 (ref 5–15)
BUN: 11 mg/dL (ref 6–20)
CO2: 25 mmol/L (ref 22–32)
Calcium: 9.6 mg/dL (ref 8.9–10.3)
Chloride: 101 mmol/L (ref 98–111)
Creatinine, Ser: 1.13 mg/dL (ref 0.61–1.24)
GFR, Estimated: >60 mL/min (ref >60)
Glucose, Bld: 97 mg/dL (ref 70–99)
Potassium: 3.7 mmol/L (ref 3.5–5.1)
Sodium: 136 mmol/L (ref 135–145)

## 2022-07-31 LAB — CBC
HCT: 48.6 % (ref 39.0–52.0)
Hemoglobin: 16.6 g/dL (ref 13.0–17.0)
MCH: 29.6 pg (ref 26.0–34.0)
MCHC: 34.2 g/dL (ref 30.0–36.0)
MCV: 86.6 fL (ref 80.0–100.0)
Platelets: 266 10*3/uL (ref 150–400)
RBC: 5.61 MIL/uL (ref 4.22–5.81)
RDW: 11.5 % (ref 11.5–15.5)
WBC: 17.3 10*3/uL — ABNORMAL HIGH (ref 4.0–10.5)
nRBC: 0 % (ref 0.0–0.2)

## 2022-07-31 LAB — ETHANOL: Alcohol, Ethyl (B): <10 mg/dL (ref <10)

## 2022-07-31 LAB — MAGNESIUM: Magnesium: 2.4 mg/dL (ref 1.7–2.4)

## 2022-07-31 NOTE — ED Provider Triage Note (Signed)
Emergency Medicine Provider Triage Evaluation Note  Joseph Cameron , a 30 y.o. male  was evaluated in triage.  Pt complains of seizure.  Patient reportedly had a seizure at work.  States that he fell from standing.  There was reported tongue biting, he was pale and diaphoretic and postictal he had urinary incontinence no evidence of oral trauma.  He has had a seizure history, states the last one was 2 years ago and he stopped taking medication about a year ago.  He currently complains of a 10 out of 10 headache, states that he did fall from standing.  Denies any blurry vision, nausea, vomiting.  Review of Systems  Positive: As above Negative: As above   Physical Exam  BP 115/80   Pulse 76   Temp 98.2 F (36.8 C) (Oral)   Resp 16   SpO2 100%  Gen:   Awake, no distress   Resp:  Normal effort  MSK:   Moves extremities without difficulty  Other:  Evidence of tongue biting, Mental Status:  Alert, thought content appropriate, able to give a coherent history. Speech fluent without evidence of aphasia. Able to follow 2 step commands without difficulty.  Cranial Nerves:  II:  Peripheral visual fields grossly normal, pupils equal, round, reactive to light III,IV, VI: ptosis not present, extra-ocular motions intact bilaterally  V,VII: smile symmetric, facial light touch sensation equal VIII: hearing grossly normal to voice  X: uvula elevates symmetrically  XI: bilateral shoulder shrug symmetric and strong XII: midline tongue extension without fassiculations Motor:  Normal tone. 5/5 strength of BUE and BLE major muscle groups including strong and equal grip strength and dorsiflexion/plantar flexion Sensory: light touch normal in all extremities. Cerebellar: normal finger-to-nose with bilateral upper extremities, Romberg sign absent    Medical Decision Making  Medically screening exam initiated at 10:41 PM.  Appropriate orders placed.  Joseph Cameron was informed that the  remainder of the evaluation will be completed by another provider, this initial triage assessment does not replace that evaluation, and the importance of remaining in the ED until their evaluation is complete.     Garald Balding, PA-C 07/31/22 [id number]

## 2022-07-31 NOTE — ED Triage Notes (Signed)
PER EMS: pt had a grand mal seizure tonight at work. EMS arrived to find him pale, diaphoretic and post ictal. + urinary incontinence and oral trauma. Hx of seizures, last one was 2 years ago. He stopped taking his seizure medication a year ago.   BP- 140/90, HR-82, 99% RA, CBG - 126 20g L hand

## 2022-08-01 LAB — RAPID URINE DRUG SCREEN, HOSP PERFORMED
Amphetamines: NOT DETECTED
Barbiturates: NOT DETECTED
Benzodiazepines: NOT DETECTED
Cocaine: NOT DETECTED
Opiates: NOT DETECTED
Tetrahydrocannabinol: POSITIVE — AB

## 2022-08-01 MED ORDER — LAMOTRIGINE 150 MG PO TABS: 150.0000 mg | ORAL_TABLET | Freq: Every day | ORAL | 0 refills | Status: DC

## 2022-08-01 NOTE — Discharge Instructions (Addendum)
Your testing today showed that you have likely had a seizure.  This means that you will need to follow-up with a neurologist.  Please restart your Lamictal today.   Please do not drive, not only is this a safety issue but it is also a legal matter and you are not legally allowed to drive until cleared by your neurologist.  Additionally please do not swim as this could cause life-threatening injury or even death if you had a seizure while swimming.  Please eat and drink a normal diet, avoid stimulant use such as cough and cold medication alcohol tobacco or drugs of abuse.  You can follow up with Dr. Audry Riles or someone from [facility] will be calling you to schedule an appointment  Return to the emergency department for ongoing seizures or severe or worsening symptoms.

## 2022-08-01 NOTE — ED Provider Notes (Signed)
[facility] EMERGENCY DEPARTMENT Provider Note   CSN: [id number] Arrival date & time: 07/31/22  2150     History  Chief Complaint  Patient presents with   Seizures    Joseph Cameron is a 31 y.o. male.  HPI 31 year old male with a history of seizures.  Pt complains of seizure. Patient reportedly had a seizure at work.  States that he fell from standing.  There was reported tongue biting, he was pale and diaphoretic and postictal per EMS.  They reported urinary incontinence w/ evidence of oral trauma.  He has had a seizure history, states the last one was 2 years ago and he stopped taking medication about a year ago.  He currently complains of a 10 out of 10 headache, states that he did fall from standing.  Denies any blurry vision, nausea, vomiting.      Home Medications Prior to Admission medications   Medication Sig Start Date End Date Taking? Authorizing Provider  lamoTRIgine (LAMICTAL) 150 MG tablet Take 1 tablet (150 mg total) by mouth daily. 08/01/22  Yes Mare Ferrari, PA-C  cephALEXin (KEFLEX) 500 MG capsule Take 1 capsule (500 mg total) by mouth 3 (three) times daily. 04/07/13   Panosh, Neta Mends, MD  lisdexamfetamine (VYVANSE) 70 MG capsule Take 70 mg by mouth every morning.    [provider]      Allergies    Patient has no known allergies.    Review of Systems   Review of Systems Ten systems reviewed and are negative for acute change, except as noted in the HPI.   Physical Exam Updated Vital Signs BP 130/74 (BP Location: Right Arm)   Pulse 83   Temp 98.4 F (36.9 C) (Oral)   Resp 17   Ht 6\' 3"  (1.905 m)   Wt 98 kg   SpO2 100%   BMI 27.00 kg/m  Physical Exam Vitals and nursing note reviewed.  Constitutional:      General: He is not in acute distress.    Appearance: He is well-developed.  HENT:     Head: Normocephalic and atraumatic.     Mouth/Throat:     Comments: Evidence of tongue biting  Eyes:      Conjunctiva/sclera: Conjunctivae normal.  Cardiovascular:     Rate and Rhythm: Normal rate and regular rhythm.     Heart sounds: No murmur heard. Pulmonary:     Effort: Pulmonary effort is normal. No respiratory distress.     Breath sounds: Normal breath sounds.  Abdominal:     Palpations: Abdomen is soft.     Tenderness: There is no abdominal tenderness.  Musculoskeletal:        General: No swelling.     Cervical back: Neck supple.  Skin:    General: Skin is warm and dry.     Capillary Refill: Capillary refill takes less than 2 seconds.  Neurological:     General: No focal deficit present.     Mental Status: He is alert and oriented to person, place, and time.     Cranial Nerves: No cranial nerve deficit.     Sensory: No sensory deficit.     Motor: No weakness.  Psychiatric:        Mood and Affect: Mood normal.     ED Results / Procedures / Treatments   Labs (all labs ordered are listed, but only abnormal results are displayed) Labs Reviewed  CBC - Abnormal; Notable for the following components:  Result Value   WBC 17.3 (*)    All other components within normal limits  RAPID URINE DRUG SCREEN, HOSP PERFORMED - Abnormal; Notable for the following components:   Tetrahydrocannabinol POSITIVE (*)    All other components within normal limits  BASIC METABOLIC PANEL  MAGNESIUM  ETHANOL    EKG None  Radiology CT Head Wo Contrast  Result Date: 07/31/2022 CLINICAL DATA:  Severe headache, fall EXAM: CT HEAD WITHOUT CONTRAST TECHNIQUE: Contiguous axial images were obtained from the base of the skull through the vertex without intravenous contrast. RADIATION DOSE REDUCTION: This exam was performed according to the departmental dose-optimization program which includes automated exposure control, adjustment of the mA and/or kV according to patient size and/or use of iterative reconstruction technique. COMPARISON:  02/07/2010 FINDINGS: Brain: No evidence of acute infarction,  hemorrhage, hydrocephalus, extra-axial collection or mass lesion/mass effect. Vascular: No hyperdense vessel or unexpected calcification. Skull: Normal. Negative for fracture or focal lesion. Sinuses/Orbits: The visualized paranasal sinuses are essentially clear. The mastoid air cells are unopacified. Other: None. IMPRESSION: Normal head CT. Electronically Signed   By: Julian Hy M.D.   On: 07/31/2022 23:17    Procedures Procedures    Medications Ordered in ED Medications - No data to display  ED Course/ Medical Decision Making/ A&P                           Medical Decision Making Amount and/or Complexity of Data Reviewed Labs: ordered. Radiology: ordered.   31 year old male presents to the ER with complaints of seizure.  Prior history of this in the past but has not been taking his meds for the last year.  Labs ordered, reviewed, CBC with leukocytosis 17.3, afebrile, suspect reactive to seizure activity.  BMP without any significant electrode abnormalities.  Magnesium normal.  Ethanol negative.  UDS positive for THC.  Given seizure, fall a CT of the head was ordered, reviewed, agree with radiology read.  Negative for acute findings.  Overall work-up reassuring.  Given prior history of seizures, plan to restart his 150 mg of Lamictal per confirmation with the patient and review of his chart.  He has been seen by Dr. Everette Rank with neurology.  Patient was educated on driving precautions, told you should not drive for the next 6 months or unless cleared by neurologist.  I discussed the case with Dr. Christy Gentles who is agreeable with the above plan.  Patient voices understanding and is agreeable.  Stable for discharge. Final Clinical Impression(s) / ED Diagnoses Final diagnoses:  Seizure [facility])    Rx / DC Orders ED Discharge Orders          Ordered    lamoTRIgine (LAMICTAL) 150 MG tablet  Daily        08/01/22 0433    Ambulatory referral to Neurology       Comments: An appointment is  requested in approximately: 1 week   08/01/22 0433              Garald Balding, PA-C 08/01/22 7001    Ripley Fraise, MD 08/01/22 [phone]

## 2022-08-03 ENCOUNTER — Ambulatory Visit (INDEPENDENT_AMBULATORY_CARE_PROVIDER_SITE_OTHER): Payer: 59 | Admitting: Neurology

## 2022-08-03 ENCOUNTER — Encounter: Payer: Self-pay | Admitting: Neurology

## 2022-08-03 VITALS — BP 118/70 | HR 60 | Ht 75.0 in | Wt 215.0 lb

## 2022-08-03 DIAGNOSIS — G40909 Epilepsy, unspecified, not intractable, without status epilepticus: Secondary | ICD-10-CM | POA: Diagnosis not present

## 2022-08-03 MED ORDER — LAMOTRIGINE ER 50 MG PO TB24: 150.0000 mg | ORAL_TABLET | Freq: Every day | ORAL | 3 refills | Status: DC

## 2022-08-03 MED ORDER — VALTOCO 15 MG DOSE 7.5 MG/0.1ML NA LQPK: 7.5000 mg | NASAL | 1 refills | Status: DC | PRN

## 2022-08-03 NOTE — Progress Notes (Signed)
[facility]  PATIENT: Joseph Cameron DOB: Jan 27, 1991  REQUESTING CLINICIAN: Garald Balding, PA-C HISTORY FROM: Patient  REASON FOR VISIT: Eplepsy    HISTORICAL  CHIEF COMPLAINT:  Chief Complaint  Patient presents with   New Patient (Initial Visit)    Rm 73, with mother  NP internal referral for Seizure  States he has not started his Lamictal prescribed  by ER because he wanted to discuss with MD first     HISTORY OF PRESENT ILLNESS:  This is a 31 year old gentleman past medical history of ADHD and epilepsy who is presenting to establish care.  Patient reported his first seizure happened at the age of 31, it was a nocturnal seizure.  At that time, he was not put on anti-seizure medication.  Then 2 years later he had another seizure, again nocturnal and was put on lamotrigine.  He was doing well on lamotrigine 150 mg extended release nightly, no seizure for the next 6 years.  He self discontinued the medication, started having episodes of severe nausea for about 30 seconds and actually ended up having a breakthrough seizure.  He restarted the medication and was doing well for next 5 years, then he self discontinued the medication and then 2 years later on July 31 2022 he had another generalized seizure while at work. He reports having a feeling of severe nausea then woke up on the floor. He was taken to the ED, initial work up unrevealing, his CT head was within normal limits.  With the seizures, he had tongue biting, fall and bruises.  Denies any other type of seizures, denies myoclonic jerks, and denies episode of staring spells.   Handedness: Right handed   Onset: At the age of 31  Seizure Type: Generalized   Current frequency: A total of 5, last one 07/31/2022  Any injuries from seizures: Tongue bite, bruises,   Seizure risk factors: Aunt, uncle from father side, history of head injury   Previous ASMs: Lamotrigine   Currenty ASMs: None    ASMs side effects: None   Brain Images: Normal head CT   Previous EEGs: unavailable for review    OTHER MEDICAL CONDITIONS: Epilepsy   REVIEW OF SYSTEMS: Full 14 system review of systems performed and negative with exception of: As noted in the HPI   ALLERGIES: No Known Allergies  HOME MEDICATIONS: Outpatient Medications Prior to Visit  Medication Sig Dispense Refill   cephALEXin (KEFLEX) 500 MG capsule Take 1 capsule (500 mg total) by mouth 3 (three) times daily. 30 capsule 0   lamoTRIgine (LAMICTAL) 150 MG tablet Take 1 tablet (150 mg total) by mouth daily. 30 tablet 0   lisdexamfetamine (VYVANSE) 70 MG capsule Take 70 mg by mouth every morning.     No facility-administered medications prior to visit.    PAST MEDICAL HISTORY: Past Medical History:  Diagnosis Date   Seizures ([place])     PAST SURGICAL HISTORY: Past Surgical History:  Procedure Laterality Date   TONSILLECTOMY      FAMILY HISTORY: No family history on file.  SOCIAL HISTORY: Social History   Socioeconomic History   Marital status: Single    Spouse name: Not on file   Number of children: Not on file   Years of education: Not on file   Highest education level: Not on file  Occupational History   Not on file  Tobacco Use   Smoking status: Never   Smokeless tobacco: Never  Substance and Sexual Activity  Alcohol use: Yes    Alcohol/week: 1.0 standard drink of alcohol    Types: 1 drink(s) per week   Drug use: No   Sexual activity: Not on file  Other Topics Concern   Not on file  Social History Narrative   Not on file   Social Determinants of Health   Financial Resource Strain: Not on file  Food Insecurity: Not on file  Transportation Needs: Not on file  Physical Activity: Not on file  Stress: Not on file  Social Connections: Not on file  Intimate Partner Violence: Not on file    PHYSICAL EXAM  GENERAL EXAM/CONSTITUTIONAL: Vitals:  Vitals:   08/03/22 1037  BP: 118/70  Pulse:  60  Weight: 215 lb (97.5 kg)  Height: 6' 3"  (1.905 m)   Body mass index is 26.87 kg/m. Wt Readings from Last 3 Encounters:  08/03/22 215 lb (97.5 kg)  07/31/22 216 lb (98 kg)  04/07/13 202 lb (91.6 kg)   Patient is in no distress; well developed, nourished and groomed; neck is supple  EYES: Pupils round and reactive to light, Visual fields full to confrontation, Extraocular movements intacts,  No results found.  MUSCULOSKELETAL: Gait, strength, tone, movements noted in Neurologic exam below  NEUROLOGIC: MENTAL STATUS:      No data to display         awake, alert, oriented to person, place and time recent and remote memory intact normal attention and concentration language fluent, comprehension intact, naming intact fund of knowledge appropriate  CRANIAL NERVE:  2nd, 3rd, 4th, 6th - pupils equal and reactive to light, visual fields full to confrontation, extraocular muscles intact, no nystagmus 5th - facial sensation symmetric 7th - facial strength symmetric 8th - hearing intact 9th - palate elevates symmetrically, uvula midline 11th - shoulder shrug symmetric 12th - tongue protrusion midline  MOTOR:  normal bulk and tone, full strength in the BUE, BLE  SENSORY:  normal and symmetric to light touch  COORDINATION:  finger-nose-finger, fine finger movements normal   GAIT/STATION:  normal     DIAGNOSTIC DATA (LABS, IMAGING, TESTING) - I reviewed patient records, labs, notes, testing and imaging myself where available.  Lab Results  Component Value Date   WBC 17.3 (H) 07/31/2022   HGB 16.6 07/31/2022   HCT 48.6 07/31/2022   MCV 86.6 07/31/2022   PLT 266 07/31/2022      Component Value Date/Time   NA 136 07/31/2022 2250   K 3.7 07/31/2022 2250   CL 101 07/31/2022 2250   CO2 25 07/31/2022 2250   GLUCOSE 97 07/31/2022 2250   BUN 11 07/31/2022 2250   CREATININE 1.13 07/31/2022 2250   CALCIUM 9.6 07/31/2022 2250   PROT 7.4 02/07/2010 0932    ALBUMIN 4.1 02/07/2010 0932   AST 29 02/07/2010 0932   ALT 20 02/07/2010 0932   ALKPHOS 59 02/07/2010 0932   BILITOT 1.7 (H) 02/07/2010 0932   GFRNONAA >60 07/31/2022 2250   GFRAA  02/07/2010 0932    NOT CALCULATED        The eGFR has been calculated using the MDRD equation. This calculation has not been validated in all clinical situations. eGFR's persistently <60 mL/min signify possible Chronic Kidney Disease.   No results found for: "CHOL", "HDL", "LDLCALC", "LDLDIRECT", "TRIG" No results found for: "HGBA1C" No results found for: "VITAMINB12" No results found for: "TSH"  Head CT 07/31/22 Normal head CT  I personally reviewed brain Images.  ASSESSMENT AND PLAN  31 y.o. year old  male  with history of epilepsy previously well managed on lamotrigine extended release 150 mg nightly who is presenting after breakthrough seizure after discontinuing his lamotrigine 2 years ago.  His seizure was described as generalized but he felt severe nausea before the seizure.  At this time, I will start him on lamotrigine extended release 150 mg nightly, will do a fast titration since he was previously on the medication, will start 50 mg nightly for 1 week and then increase by 50 mg weekly.  Discussed side effect including rash.  Patient understands to stop the medication and contact me if he experiences any side effects. I will give him a rescue medication, Valtoco while we are titrating his Lamotrigine.  I will also obtain a routine EEG, the goal is to determine if he has focal or generalized seizure.  I will see him in 3 months.  Follow-up, at that time we will check a lamotrigine level and BMP.   1. Seizure disorder [facility])     Patient Instructions  Restart Lamotrigine, fast taper, he was previously on 150 mg nightly.   Week 1 50 mg nightly   Week 2 100 mg nighty   Week 3 150 mg nightly  Please monitor for rash while restarting the medication.  Will prescribed Valtoco for rescue medication   Routine EEG  Follow up in 3 months, at that time will obtain Lamotrigine level and BMP  Driving restriction for the next 6 months.    Per [facility] statutes, patients with seizures are not allowed to drive until they have been seizure-free for six months.  Other recommendations include using caution when using heavy equipment or power tools. Avoid working on ladders or at heights. Take showers instead of baths.  Do not swim alone.  Ensure the water temperature is not too high on the home water heater. Do not go swimming alone. Do not lock yourself in a room alone (i.e. bathroom). When caring for infants or small children, sit down when holding, feeding, or changing them to minimize risk of injury to the child in the event you have a seizure. Maintain good sleep hygiene. Avoid alcohol.  Also recommend adequate sleep, hydration, good diet and minimize stress.   During the Seizure  - First, ensure adequate ventilation and place patients on the floor on their left side  Loosen clothing around the neck and ensure the airway is patent. If the patient is clenching the teeth, do not force the mouth open with any object as this can cause severe damage - Remove all items from the surrounding that can be hazardous. The patient may be oblivious to what's happening and may not even know what he or she is doing. If the patient is confused and wandering, either gently guide him/her away and block access to outside areas - Reassure the individual and be comforting - Call 911. In most cases, the seizure ends before EMS arrives. However, there are cases when seizures may last over 3 to 5 minutes. Or the individual may have developed breathing difficulties or severe injuries. If a pregnant patient or a person with diabetes develops a seizure, it is prudent to call an ambulance. - Finally, if the patient does not regain full consciousness, then call EMS. Most patients will remain confused for about 45 to 90  minutes after a seizure, so you must use judgment in calling for help. - Avoid restraints but make sure the patient is in a bed with padded side rails -  Place the individual in a lateral position with the neck slightly flexed; this will help the saliva drain from the mouth and prevent the tongue from falling backward - Remove all nearby furniture and other hazards from the area - Provide verbal assurance as the individual is regaining consciousness - Provide the patient with privacy if possible - Call for help and start treatment as ordered by the caregiver   After the Seizure (Postictal Stage)  After a seizure, most patients experience confusion, fatigue, muscle pain and/or a headache. Thus, one should permit the individual to sleep. For the next few days, reassurance is essential. Being calm and helping reorient the person is also of importance.  Most seizures are painless and end spontaneously. Seizures are not harmful to others but can lead to complications such as stress on the lungs, brain and the heart. Individuals with prior lung problems may develop labored breathing and respiratory distress.     Orders Placed This Encounter  Procedures   EEG adult    Meds ordered this encounter  Medications   lamoTRIgine (LAMICTAL XR) 50 MG 24 hour tablet    Sig: Take 3 tablets (150 mg total) by mouth daily.    Dispense:  270 tablet    Refill:  3    Return in about 3 months (around 11/03/2022).    Alric Ran, MD 08/03/2022, 11:23 AM  [facility] [address] [place], [place] [address] [phone]

## 2022-08-03 NOTE — Patient Instructions (Addendum)
Restart Lamotrigine, fast taper, he was previously on 150 mg nightly.   Week 1 50 mg nightly   Week 2 100 mg nighty   Week 3 150 mg nightly  Please monitor for rash while restarting the medication.  Will prescribed Valtoco for rescue medication  Routine EEG  Follow up in 3 months, at that time will obtain Lamotrigine level and BMP  Driving restriction for the next 6 months.

## 2022-08-14 ENCOUNTER — Other Ambulatory Visit: Payer: 59 | Admitting: *Deleted

## 2022-08-22 ENCOUNTER — Ambulatory Visit (INDEPENDENT_AMBULATORY_CARE_PROVIDER_SITE_OTHER): Payer: 59 | Admitting: Neurology

## 2022-08-22 DIAGNOSIS — G40909 Epilepsy, unspecified, not intractable, without status epilepticus: Secondary | ICD-10-CM | POA: Diagnosis not present

## 2022-08-22 NOTE — Procedures (Signed)
    History:  31 year old man with seizure disorder   EEG classification: Awake and drowsy  Description of the recording: The background rhythms of this recording consists of a fairly well modulated medium amplitude alpha rhythm of 12 Hz that is reactive to eye opening and closure. Present in the anterior head region is a 15-20 Hz beta activity. Photic stimulation was performed, did not show any abnormalities. Hyperventilation was also performed, did not show any abnormalities. Drowsiness was manifested by background fragmentation. No abnormal epileptiform discharges seen during this recording. There was no focal slowing. There were no electrographic seizure identified.   Abnormality: None   Impression: This is a normal EEG recorded while drowsy and awake. No evidence of interictal epileptiform discharges. Normal EEGs, however, do not rule out epilepsy.    Alric Ran, MD [facility]

## 2022-10-17 ENCOUNTER — Emergency Department (HOSPITAL_COMMUNITY): Payer: 59

## 2022-10-17 ENCOUNTER — Other Ambulatory Visit: Payer: Self-pay

## 2022-10-17 ENCOUNTER — Emergency Department (HOSPITAL_COMMUNITY)
Admission: EM | Admit: 2022-10-17 | Discharge: 2022-10-18 | Disposition: A | Discharge status: Home / Self Care | Payer: 59 | Attending: Emergency Medicine | Admitting: Emergency Medicine

## 2022-10-17 ENCOUNTER — Encounter (HOSPITAL_COMMUNITY): Payer: Self-pay | Admitting: *Deleted

## 2022-10-17 DIAGNOSIS — F17200 Nicotine dependence, unspecified, uncomplicated: Secondary | ICD-10-CM | POA: Insufficient documentation

## 2022-10-17 DIAGNOSIS — R0789 Other chest pain: Secondary | ICD-10-CM

## 2022-10-17 DIAGNOSIS — R079 Chest pain, unspecified: Secondary | ICD-10-CM | POA: Diagnosis present

## 2022-10-17 LAB — CBC
HCT: 46.6 % (ref 39.0–52.0)
Hemoglobin: 15.7 g/dL (ref 13.0–17.0)
MCH: 29.5 pg (ref 26.0–34.0)
MCHC: 33.7 g/dL (ref 30.0–36.0)
MCV: 87.6 fL (ref 80.0–100.0)
Platelets: 245 10*3/uL (ref 150–400)
RBC: 5.32 MIL/uL (ref 4.22–5.81)
RDW: 11.6 % (ref 11.5–15.5)
WBC: 7.2 10*3/uL (ref 4.0–10.5)
nRBC: 0 % (ref 0.0–0.2)

## 2022-10-17 LAB — BASIC METABOLIC PANEL
Anion gap: 11 (ref 5–15)
BUN: 10 mg/dL (ref 6–20)
CO2: 29 mmol/L (ref 22–32)
Calcium: 9.1 mg/dL (ref 8.9–10.3)
Chloride: 100 mmol/L (ref 98–111)
Creatinine, Ser: 0.88 mg/dL (ref 0.61–1.24)
GFR, Estimated: >60 mL/min (ref >60)
Glucose, Bld: 88 mg/dL (ref 70–99)
Potassium: 4.3 mmol/L (ref 3.5–5.1)
Sodium: 140 mmol/L (ref 135–145)

## 2022-10-17 LAB — TROPONIN I (HIGH SENSITIVITY)
Troponin I (High Sensitivity): <2 ng/L (ref <18)
Troponin I (High Sensitivity): <2 ng/L (ref <18)

## 2022-10-17 NOTE — ED Triage Notes (Signed)
Patient reports he was seen at [facility] due to having chest pain.  He states he was advised to come to the ED.  Patient states he has had intermittent chest pain since last Tuesday.  Patient denies any changes to meds.  No trauma.  Patient recent illness.  Patient EKG with "peaked twaves" per the report from [facility].  Patient denies any shortness of breath  he denies any diaphoresis.  Patient is alert, skin warm and dry.  He is a seizure patient taking lamitrigine daily.

## 2022-10-18 NOTE — ED Provider Notes (Signed)
[facility] DEPT Provider Note   CSN: [id number] Arrival date & time: 10/17/22  1303     History  Chief Complaint  Patient presents with   Chest Pain    Joseph Cameron is a 32 y.o. male.  The history is provided by the patient and medical records.  Chest Pain Joseph Cameron is a 32 y.o. male who presents to the Emergency Department complaining of chest pain.  Pain started about 1 week ago and is in the central chest and radiates at times to the right and the left.  At first he thought he possibly overstretched but symptoms have been recurrent.  Sometimes he feels like his heart is pushing up against his rib cage.  There is a tight sensation.  His pain is worse with application of pressure.  It does wax and wane and he has episodes that last 15 to 30 minutes at a time.  Pain is also worse with exertion.   No fever, cough, sob, AP, N/V, leg swelling/pain.    Hx/o seizure d/o takes lamotrigine.    Family hx/o DM.   No hx/o DVT/PE.  Occasional tobacco, occasional alcohol.       Home Medications Prior to Admission medications   Medication Sig Start Date End Date Taking? Authorizing Provider  diazePAM, 15 MG Dose, (VALTOCO 15 MG DOSE) 2 x 7.5 MG/0.1ML LQPK Place 7.5 mg into the nose as needed (For seizure lasting more than 2 minutes). 08/03/22   Alric Ran, MD  lamoTRIgine (LAMICTAL XR) 50 MG 24 hour tablet Take 3 tablets (150 mg total) by mouth daily. 08/03/22 07/29/23  Alric Ran, MD      Allergies    Patient has no known allergies.    Review of Systems   Review of Systems  Cardiovascular:  Positive for chest pain.  All other systems reviewed and are negative.   Physical Exam Updated Vital Signs BP 124/73   Pulse 77   Temp 98.2 F (36.8 C)   Resp 17   Ht 6\' 3"  (1.905 m)   Wt 103.4 kg   SpO2 100%   BMI 28.50 kg/m  Physical Exam Vitals and nursing note reviewed.  Constitutional:      Appearance: He is  well-developed.  HENT:     Head: Normocephalic and atraumatic.  Cardiovascular:     Rate and Rhythm: Normal rate and regular rhythm.     Heart sounds: No murmur heard. Pulmonary:     Effort: Pulmonary effort is normal. No respiratory distress.     Breath sounds: Normal breath sounds.  Abdominal:     Palpations: Abdomen is soft.     Tenderness: There is no abdominal tenderness. There is no guarding or rebound.  Musculoskeletal:        General: No tenderness.  Skin:    General: Skin is warm and dry.  Neurological:     Mental Status: He is alert and oriented to person, place, and time.  Psychiatric:        Behavior: Behavior normal.     ED Results / Procedures / Treatments   Labs (all labs ordered are listed, but only abnormal results are displayed) Labs Reviewed  BASIC METABOLIC PANEL  CBC  TROPONIN I (HIGH SENSITIVITY)  TROPONIN I (HIGH SENSITIVITY)    EKG None  Radiology DG Chest 2 View  Result Date: 10/17/2022 CLINICAL DATA:  Chest pain. EXAM: CHEST - 2 VIEW COMPARISON:  02/08/2010 FINDINGS: The cardiomediastinal contours are normal. The  lungs are clear. Pulmonary vasculature is normal. No consolidation, pleural effusion, or pneumothorax. No acute osseous abnormalities are seen. IMPRESSION: Negative radiographs of the chest. Electronically Signed   By: Keith Rake M.D.   On: 10/17/2022 16:04    Procedures Procedures    Medications Ordered in ED Medications - No data to display  ED Course/ Medical Decision Making/ A&P                           Medical Decision Making Amount and/or Complexity of Data Reviewed Labs: ordered. Radiology: ordered.   Patient here for evaluation of chest pain, he does have some pain with palpation of the chest but also has pain on exertion.  EKG is without acute ischemic changes and troponins are negative x 2.  Chest x-ray is without acute abnormality-images personally reviewed and interpreted, agree with radiologist  interpretation.  Current clinical picture is not consistent with ACS, dissection.  Doubt PE he is PERC negative.  Discussed with patient trial of NSAIDs for chest pain, possibly musculoskeletal.  Given he does have an exertional component will refer to cardiology.  Feel he is stable for discharge home with outpatient follow-up and return precautions.        Final Clinical Impression(s) / ED Diagnoses Final diagnoses:  Atypical chest pain    Rx / DC Orders ED Discharge Orders          Ordered    Ambulatory referral to Cardiology       Comments: Exertional chest pain   10/18/22 0250              Quintella Reichert, MD 10/18/22 [phone]

## 2022-10-26 ENCOUNTER — Encounter: Payer: Self-pay | Admitting: Neurology

## 2022-10-26 ENCOUNTER — Ambulatory Visit (INDEPENDENT_AMBULATORY_CARE_PROVIDER_SITE_OTHER): Payer: 59 | Admitting: Neurology

## 2022-10-26 VITALS — BP 112/69 | HR 61 | Ht 75.0 in | Wt 228.0 lb

## 2022-10-26 DIAGNOSIS — G40909 Epilepsy, unspecified, not intractable, without status epilepticus: Secondary | ICD-10-CM | POA: Diagnosis not present

## 2022-10-26 DIAGNOSIS — Z5181 Encounter for therapeutic drug level monitoring: Secondary | ICD-10-CM | POA: Diagnosis not present

## 2022-10-26 MED ORDER — LAMOTRIGINE ER 50 MG PO TB24: 150.0000 mg | ORAL_TABLET | Freq: Every day | ORAL | 3 refills | Status: DC

## 2022-10-26 NOTE — Patient Instructions (Addendum)
With lamotrigine 150 mg daily Will check a lamotrigine level with BMP He understand no driving until March 6734 Follow-up in 1 year or sooner if worse

## 2022-10-26 NOTE — Progress Notes (Signed)
[facility]  PATIENT: Joseph Cameron DOB: 02-10-1991  REQUESTING CLINICIAN: No ref. provider found HISTORY FROM: Patient  REASON FOR VISIT: Eplepsy    HISTORICAL  CHIEF COMPLAINT:  Chief Complaint  Patient presents with   Follow-up    Rm 12 with mother  States he is doing well, reports no new seizures     INTERVAL HISTORY 10/26/2022:  Patient presents today for follow-up, he is accompanied by her mother.  Since last visit in October, he has not had any additional seizures.  He is compliant with the lamotrigine XR 150 mg daily, denies any side effect and no other complaints.  He reported in the last week of December he was experiencing chest pain.  He presented to the ED all his workup has been negative.   HISTORY OF PRESENT ILLNESS:  This is a 32 year old gentleman past medical history of ADHD and epilepsy who is presenting to establish care.  Patient reported his first seizure happened at the age of 32, it was a nocturnal seizure.  At that time, he was not put on anti-seizure medication.  Then 2 years later he had another seizure, again nocturnal and was put on lamotrigine.  He was doing well on lamotrigine 150 mg extended release nightly, no seizure for the next 6 years.  He self discontinued the medication, started having episodes of severe nausea for about 30 seconds and actually ended up having a breakthrough seizure.  He restarted the medication and was doing well for next 5 years, then he self discontinued the medication and then 2 years later on July 31 2022 he had another generalized seizure while at work. He reports having a feeling of severe nausea then woke up on the floor. He was taken to the ED, initial work up unrevealing, his CT head was within normal limits.  With the seizures, he had tongue biting, fall and bruises.  Denies any other type of seizures, denies myoclonic jerks, and denies episode of staring spells.   Handedness: Right handed    Onset: At the age of 32  Seizure Type: Generalized   Current frequency: A total of 5, last one 07/31/2022  Any injuries from seizures: Tongue bite, bruises,   Seizure risk factors: Aunt, uncle from father side, history of head injury   Previous ASMs: Lamotrigine   Currenty ASMs: Lamotrigine XR 150 mg daily   ASMs side effects: None   Brain Images: Normal head CT   Previous EEGs: unavailable for review    OTHER MEDICAL CONDITIONS: Epilepsy   REVIEW OF SYSTEMS: Full 14 system review of systems performed and negative with exception of: As noted in the HPI   ALLERGIES: No Known Allergies  HOME MEDICATIONS: Outpatient Medications Prior to Visit  Medication Sig Dispense Refill   diazePAM, 15 MG Dose, (VALTOCO 15 MG DOSE) 2 x 7.5 MG/0.1ML LQPK Place 7.5 mg into the nose as needed (For seizure lasting more than 2 minutes). 2 each 1   lamoTRIgine (LAMICTAL XR) 50 MG 24 hour tablet Take 3 tablets (150 mg total) by mouth daily. 270 tablet 3   No facility-administered medications prior to visit.    PAST MEDICAL HISTORY: Past Medical History:  Diagnosis Date   Seizures (HCC)     PAST SURGICAL HISTORY: Past Surgical History:  Procedure Laterality Date   TONSILLECTOMY      FAMILY HISTORY: History reviewed. No pertinent family history.  SOCIAL HISTORY: Social History   Socioeconomic History   Marital status: Single  Spouse name: Not on file   Number of children: Not on file   Years of education: Not on file   Highest education level: Not on file  Occupational History   Not on file  Tobacco Use   Smoking status: Never   Smokeless tobacco: Current  Substance and Sexual Activity   Alcohol use: Yes    Alcohol/week: 1.0 standard drink of alcohol    Types: 1 drink(s) per week   Drug use: No   Sexual activity: Not on file  Other Topics Concern   Not on file  Social History Narrative   Not on file   Social Determinants of Health   Financial Resource  Strain: Not on file  Food Insecurity: Not on file  Transportation Needs: Not on file  Physical Activity: Not on file  Stress: Not on file  Social Connections: Not on file  Intimate Partner Violence: Not on file    PHYSICAL EXAM  GENERAL EXAM/CONSTITUTIONAL: Vitals:  Vitals:   10/26/22 1121  BP: 112/69  Pulse: 61  Weight: 228 lb (103.4 kg)  Height: 6\' 3"  (1.905 m)   Body mass index is 28.5 kg/m. Wt Readings from Last 3 Encounters:  10/26/22 228 lb (103.4 kg)  10/17/22 228 lb (103.4 kg)  08/03/22 215 lb (97.5 kg)   Patient is in no distress; well developed, nourished and groomed; neck is supple  EYES: Visual fields full to confrontation, Extraocular movements intacts,  No results found.  MUSCULOSKELETAL: Gait, strength, tone, movements noted in Neurologic exam below  NEUROLOGIC: MENTAL STATUS:      No data to display         awake, alert, oriented to person, place and time recent and remote memory intact normal attention and concentration language fluent, comprehension intact, naming intact fund of knowledge appropriate  CRANIAL NERVE:  2nd, 3rd, 4th, 6th - visual fields full to confrontation, extraocular muscles intact, no nystagmus 5th - facial sensation symmetric 7th - facial strength symmetric 8th - hearing intact 9th - palate elevates symmetrically, uvula midline 11th - shoulder shrug symmetric 12th - tongue protrusion midline  MOTOR:  normal bulk and tone, full strength in the BUE, BLE  SENSORY:  normal and symmetric to light touch  COORDINATION:  finger-nose-finger, fine finger movements normal   GAIT/STATION:  normal   DIAGNOSTIC DATA (LABS, IMAGING, TESTING) - I reviewed patient records, labs, notes, testing and imaging myself where available.  Lab Results  Component Value Date   WBC 7.2 10/17/2022   HGB 15.7 10/17/2022   HCT 46.6 10/17/2022   MCV 87.6 10/17/2022   PLT 245 10/17/2022      Component Value Date/Time   NA 140  10/17/2022 1545   K 4.3 10/17/2022 1545   CL 100 10/17/2022 1545   CO2 29 10/17/2022 1545   GLUCOSE 88 10/17/2022 1545   BUN 10 10/17/2022 1545   CREATININE 0.88 10/17/2022 1545   CALCIUM 9.1 10/17/2022 1545   PROT 7.4 02/07/2010 0932   ALBUMIN 4.1 02/07/2010 0932   AST 29 02/07/2010 0932   ALT 20 02/07/2010 0932   ALKPHOS 59 02/07/2010 0932   BILITOT 1.7 (H) 02/07/2010 0932   GFRNONAA >60 10/17/2022 1545   GFRAA  02/07/2010 0932    NOT CALCULATED        The eGFR has been calculated using the MDRD equation. This calculation has not been validated in all clinical situations. eGFR's persistently <60 mL/min signify possible Chronic Kidney Disease.   No results found for: "  CHOL", "HDL", "LDLCALC", "LDLDIRECT", "TRIG" No results found for: "HGBA1C" No results found for: "VITAMINB12" No results found for: "TSH"  Head CT 07/31/22 Normal head CT   Routine EEG 08/22/2022 Normal    I personally reviewed brain Images.  ASSESSMENT AND PLAN  32 y.o. year old male  with history of epilepsy who is presenting for follow up. He is doing well on Lamotrigine XR 150 mg daily, no seizures, and no side effects. We will check a level and continue patient on current medications. I will see him in 1 year or sooner if worse.     1. Seizure disorder (HCC)   2. Therapeutic drug monitoring      Patient Instructions  With lamotrigine 150 mg daily Will check a lamotrigine level with BMP He understand no driving until March 2024 Follow-up in 1 year or sooner if worse   Per [facility] statutes, patients with seizures are not allowed to drive until they have been seizure-free for six months.  Other recommendations include using caution when using heavy equipment or power tools. Avoid working on ladders or at heights. Take showers instead of baths.  Do not swim alone.  Ensure the water temperature is not too high on the home water heater. Do not go swimming alone. Do not lock yourself  in a room alone (i.e. bathroom). When caring for infants or small children, sit down when holding, feeding, or changing them to minimize risk of injury to the child in the event you have a seizure. Maintain good sleep hygiene. Avoid alcohol.  Also recommend adequate sleep, hydration, good diet and minimize stress.   During the Seizure  - First, ensure adequate ventilation and place patients on the floor on their left side  Loosen clothing around the neck and ensure the airway is patent. If the patient is clenching the teeth, do not force the mouth open with any object as this can cause severe damage - Remove all items from the surrounding that can be hazardous. The patient may be oblivious to what's happening and may not even know what he or she is doing. If the patient is confused and wandering, either gently guide him/her away and block access to outside areas - Reassure the individual and be comforting - Call 911. In most cases, the seizure ends before EMS arrives. However, there are cases when seizures may last over 3 to 5 minutes. Or the individual may have developed breathing difficulties or severe injuries. If a pregnant patient or a person with diabetes develops a seizure, it is prudent to call an ambulance. - Finally, if the patient does not regain full consciousness, then call EMS. Most patients will remain confused for about 45 to 90 minutes after a seizure, so you must use judgment in calling for help. - Avoid restraints but make sure the patient is in a bed with padded side rails - Place the individual in a lateral position with the neck slightly flexed; this will help the saliva drain from the mouth and prevent the tongue from falling backward - Remove all nearby furniture and other hazards from the area - Provide verbal assurance as the individual is regaining consciousness - Provide the patient with privacy if possible - Call for help and start treatment as ordered by the  caregiver   After the Seizure (Postictal Stage)  After a seizure, most patients experience confusion, fatigue, muscle pain and/or a headache. Thus, one should permit the individual to sleep. For the next few  days, reassurance is essential. Being calm and helping reorient the person is also of importance.  Most seizures are painless and end spontaneously. Seizures are not harmful to others but can lead to complications such as stress on the lungs, brain and the heart. Individuals with prior lung problems may develop labored breathing and respiratory distress.     Orders Placed This Encounter  Procedures   Lamotrigine level   Basic Metabolic Panel   Vitamin D, 25-hydroxy    Meds ordered this encounter  Medications   lamoTRIgine (LAMICTAL XR) 50 MG 24 hour tablet    Sig: Take 3 tablets (150 mg total) by mouth daily.    Dispense:  450 tablet    Refill:  3    Return in about 1 year (around 10/27/2023).    Alric Ran, MD 10/26/2022, 12:46 PM  [facility] [address], [place], [place] [address] [phone]

## 2022-10-28 LAB — BASIC METABOLIC PANEL
BUN/Creatinine Ratio: 14 (ref 9–20)
BUN: 13 mg/dL (ref 6–20)
CO2: 28 mmol/L (ref 20–29)
Calcium: 9.7 mg/dL (ref 8.7–10.2)
Chloride: 99 mmol/L (ref 96–106)
Creatinine, Ser: 0.92 mg/dL (ref 0.76–1.27)
Glucose: 73 mg/dL (ref 70–99)
Potassium: 4.6 mmol/L (ref 3.5–5.2)
Sodium: 141 mmol/L (ref 134–144)
eGFR: 115 mL/min/{1.73_m2} (ref >59)

## 2022-10-28 LAB — VITAMIN D 25 HYDROXY (VIT D DEFICIENCY, FRACTURES): Vit D, 25-Hydroxy: 11.9 ng/mL — ABNORMAL LOW (ref 30.0–100.0)

## 2022-10-28 LAB — LAMOTRIGINE LEVEL: Lamotrigine Lvl: 2.2 ug/mL (ref 2.0–20.0)

## 2022-11-03 ENCOUNTER — Encounter: Payer: Self-pay | Admitting: Family Medicine

## 2022-11-03 ENCOUNTER — Ambulatory Visit (INDEPENDENT_AMBULATORY_CARE_PROVIDER_SITE_OTHER): Payer: 59 | Admitting: Family Medicine

## 2022-11-03 VITALS — BP 120/72 | HR 72 | Temp 97.7°F | Ht 75.67 in | Wt 228.0 lb

## 2022-11-03 DIAGNOSIS — G40209 Localization-related (focal) (partial) symptomatic epilepsy and epileptic syndromes with complex partial seizures, not intractable, without status epilepticus: Secondary | ICD-10-CM

## 2022-11-03 DIAGNOSIS — Z720 Tobacco use: Secondary | ICD-10-CM

## 2022-11-03 DIAGNOSIS — Z13220 Encounter for screening for lipoid disorders: Secondary | ICD-10-CM | POA: Diagnosis not present

## 2022-11-03 DIAGNOSIS — R079 Chest pain, unspecified: Secondary | ICD-10-CM

## 2022-11-03 DIAGNOSIS — G40909 Epilepsy, unspecified, not intractable, without status epilepticus: Secondary | ICD-10-CM | POA: Diagnosis not present

## 2022-11-03 DIAGNOSIS — Z1329 Encounter for screening for other suspected endocrine disorder: Secondary | ICD-10-CM

## 2022-11-03 DIAGNOSIS — Z130 Encounter for screening for diseases of the blood and blood-forming organs and certain disorders involving the immune mechanism: Secondary | ICD-10-CM

## 2022-11-03 DIAGNOSIS — Z13228 Encounter for screening for other metabolic disorders: Secondary | ICD-10-CM

## 2022-11-03 DIAGNOSIS — R110 Nausea: Secondary | ICD-10-CM

## 2022-11-03 DIAGNOSIS — E559 Vitamin D deficiency, unspecified: Secondary | ICD-10-CM | POA: Insufficient documentation

## 2022-11-03 DIAGNOSIS — Z1321 Encounter for screening for nutritional disorder: Secondary | ICD-10-CM

## 2022-11-03 MED ORDER — VITAMIN D (ERGOCALCIFEROL) 1.25 MG (50000 UNIT) PO CAPS: 50000.0000 [IU] | ORAL_CAPSULE | ORAL | 0 refills | Status: DC

## 2022-11-03 NOTE — Assessment & Plan Note (Signed)
The patient has a history of seizures, with a normal neurological workup including normal MRI scans. The patient has been stable on Levetiracetam. It's noted that the patient took themselves off medication previously, leading to another seizure event.   Plan: The patient should continue taking lamotrigine as prescribed and remain under the care of their neurologist. Given the patient's history of stopping medication and subsequent seizure, emphasis on medication adherence is needed. A review of their current dosage and frequency of lamotrigine is also recommended to ensure optimal seizure control.

## 2022-11-03 NOTE — Assessment & Plan Note (Signed)
Counseled on cessation 

## 2022-11-03 NOTE — Assessment & Plan Note (Signed)
Plan: Order fasting lipid panel to evaluate cholesterol levels. Provide diet and lifestyle modification counseling for cholesterol management, including a heart-healthy diet, regular exercise, and smoking cessation support. Reevaluate the need for pharmacological intervention based on the results of the lipid panel.

## 2022-11-03 NOTE — Assessment & Plan Note (Signed)
The patient reports a known vitamin D deficiency and was previously on over-the-counter supplements.  Plan: Prescribe vitamin D, 50,000 IU, to be taken once a week. Schedule a follow-up visit to reassess levels and adjust treatment as needed, with the potential for a maintenance dose after correction.

## 2022-11-03 NOTE — Patient Instructions (Addendum)
Come back for fasting labs. Start Vitamin D.

## 2022-11-03 NOTE — Assessment & Plan Note (Signed)
The patient experienced an episode of chest pain that was assessed in the ED with normal findings, including normal EKG and troponins.  Plan: Consider the benign nature of the episode due to normal workup. Advise the patient to monitor symptoms and seek immediate care if pain recurs or worsens. If the pain becomes recurrent or concerning, consider further cardiological evaluation.

## 2022-11-03 NOTE — Progress Notes (Signed)
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 The patient has been stable on Levetiracetam. It's noted that the patient took themselves off medication previously, leading to another seizure event.   Plan: The patient should continue taking lamotrigine as prescribed and remain under the care of their neurologist. Given the patient's history of stopping medication and subsequent seizure, emphasis on medication adherence is needed. A review of their current dosage and frequency of lamotrigine is also recommended to ensure optimal seizure control.        Other   Screening, lipid    Plan: Order fasting lipid panel to evaluate cholesterol levels. Provide diet and lifestyle modification counseling for cholesterol management, including a heart-healthy diet, regular exercise, and smoking cessation support. Reevaluate the need for pharmacological intervention based on the results of the lipid panel.      Relevant Orders   Lipid Profile   TSH   Chest pain    The patient experienced an episode of chest pain that was assessed in the ED with normal findings, including normal EKG and troponins.  Plan: Consider the benign nature of the episode due to normal workup. Advise the patient to monitor symptoms and seek immediate care if pain recurs or worsens. If the pain becomes recurrent or concerning, consider further cardiological evaluation.      Relevant Orders   Lipid Profile   TSH   Hemoglobin A1C   Vitamin D deficiency    The patient reports a known vitamin D deficiency and was previously on over-the-counter supplements.  Plan: Prescribe vitamin D, 50,000 IU, to be taken once a week. Schedule a follow-up visit to reassess levels and adjust treatment as needed, with the potential for a  maintenance dose after correction.      Relevant Medications   Vitamin D, Ergocalciferol, (DRISDOL) 1.25 MG (50000 UNIT) CAPS capsule   Other Relevant Orders   Hemoglobin A1C   Tobacco use    Counseled on cessation      Other Visit Diagnoses     Seizure disorder ([place])    -  Primary   Relevant Orders   TSH   Hemoglobin A1C   Screening for endocrine, nutritional, metabolic and immunity disorder       Relevant Orders   TSH   Hemoglobin A1C   Nausea       Relevant Orders   Hemoglobin A1C       There are no discontinued medications.    Subjective:  HPI: Encounter date: 11/03/2022  Joseph Cameron is a 32 y.o. male who has Attention deficit hyperactivity disorder (ADHD); Focal epilepsy with impairment of consciousness ([place]); Screening, lipid; Chest pain; Vitamin D deficiency; and Tobacco use on their problem list..   He  has a past medical history of Acute tonsillitis (04/07/2013), Seizures ([place]), and SYNCOPE, VASOVAGAL (07/05/2009)..   CHIEF COMPLAINT: Patient presents to establish care, with a history of seizures, concerns about vitamin D levels, cholesterol levels, and a recent episode of chest pain.    HISTORY OF PRESENT ILLNESS:  Problem 1: The patient reports a history of seizures, with the most recent seizure occurring in October of the previous year. Multiple investigations including MRIs have been performed in the past, all yielding normal results. The patient is currently stable on Levetiracetam.  Problem 2: The patient also expresses concern regarding cholesterol levels. This issue has not been evaluated recently, so the patient is open to having their lipid profile checked.  Problem 3: Approximately two and a half weeks prior to the encounter, the patient experienced chest pain and tightness, prompting a visit to the emergency department (ED). The workup at the ED, including EKG and blood tests such as troponins, was normal. The patient was advised to  establish care with a primary care provider for follow-up and further evaluation.  Problem 4: The patient has a known deficiency in vitamin D, which was previously being managed with over-the-counter supplements. The patient acknowledges falling off the regimen and expresses interest in potentially restarting prescription therapy to correct the deficiency.  REVIEW OF SYSTEMS: Patient reports occasional nausea and dizziness, potentially related to their seizure disorder. These episodes can happen sporadically, sometimes leading up to a seizure. Otherwise, there are no pertinent positive findings. The rest of the systems were reviewed and are negative.   Past Surgical History:  Procedure Laterality Date   TONSILLECTOMY      Outpatient Medications Prior to Visit  Medication Sig Dispense Refill   diazePAM, 15 MG Dose, (VALTOCO 15 MG DOSE) 2 x 7.5 MG/0.1ML LQPK Place 7.5 mg into the nose as needed (For seizure lasting more than 2 minutes). 2 each 1   lamoTRIgine (LAMICTAL XR) 50 MG 24 hour tablet Take 3 tablets (150 mg total) by mouth daily. 450 tablet 3   No facility-administered medications prior to visit.    Family History  Problem Relation Age of Onset   Hyperlipidemia Mother    Diabetes Mother    Hypertension Father    Seizures Maternal Aunt    Diabetes Maternal Grandmother    Leukemia Maternal Grandmother    Diabetes Maternal Grandfather    Hypertension Paternal Grandmother    Hypertension Paternal Grandfather     Social History   Socioeconomic History   Marital status: Single    Spouse name: Not on file   Number of children: Not on file   Years of education: Not on file   Highest education level: Not on file  Occupational History   Not on file  Tobacco Use   Smoking status: Some Days   Smokeless tobacco: Current  Vaping Use   Vaping Use: Every day  Substance and Sexual Activity   Alcohol use: Yes    Alcohol/week: 1.0 standard drink of alcohol    Types: 1 drink(s)  per week   Drug use: No   Sexual activity: Not on file  Other Topics Concern   Not on file  Social History Narrative   Not on file   Social Determinants of Health   Financial Resource Strain: Not on file  Food Insecurity: Not on file  Transportation Needs: Not on file  Physical Activity: Not on file  Stress: Not on file  Social Connections: Not on file  Intimate Partner Violence: Not on file  Objective:  Physical Exam: BP 120/72 (BP Location: Left Arm, Patient Position: Sitting, Cuff Size: Large)   Pulse 72   Temp 97.7 F (36.5 C) (Oral)   Ht 6' 3.67" (1.922 m)   Wt 228 lb (103.4 kg)   SpO2 96%   BMI 28.00 kg/m     Gen: NAD, resting comfortably CV: RRR with no murmurs appreciated Pulm: NWOB, CTAB with no crackles, wheezes, or rhonchi GI: Normal bowel sounds present. Soft, Nontender, Nondistended. MSK: no edema, cyanosis, or clubbing noted Skin: warm, dry Neuro: grossly normal, moves all extremities Psych: Normal affect and thought content        Garner Nash, MD, MS

## 2022-11-06 ENCOUNTER — Other Ambulatory Visit (INDEPENDENT_AMBULATORY_CARE_PROVIDER_SITE_OTHER): Payer: 59

## 2022-11-06 DIAGNOSIS — Z130 Encounter for screening for diseases of the blood and blood-forming organs and certain disorders involving the immune mechanism: Secondary | ICD-10-CM

## 2022-11-06 DIAGNOSIS — Z13220 Encounter for screening for lipoid disorders: Secondary | ICD-10-CM | POA: Diagnosis not present

## 2022-11-06 DIAGNOSIS — E559 Vitamin D deficiency, unspecified: Secondary | ICD-10-CM

## 2022-11-06 DIAGNOSIS — Z1329 Encounter for screening for other suspected endocrine disorder: Secondary | ICD-10-CM | POA: Diagnosis not present

## 2022-11-06 DIAGNOSIS — Z13228 Encounter for screening for other metabolic disorders: Secondary | ICD-10-CM

## 2022-11-06 DIAGNOSIS — G40909 Epilepsy, unspecified, not intractable, without status epilepticus: Secondary | ICD-10-CM | POA: Diagnosis not present

## 2022-11-06 DIAGNOSIS — R110 Nausea: Secondary | ICD-10-CM

## 2022-11-06 DIAGNOSIS — R079 Chest pain, unspecified: Secondary | ICD-10-CM

## 2022-11-06 DIAGNOSIS — Z1321 Encounter for screening for nutritional disorder: Secondary | ICD-10-CM

## 2022-11-06 LAB — HEMOGLOBIN A1C: Hgb A1c MFr Bld: 5.2 % (ref 4.6–6.5)

## 2022-11-06 LAB — LIPID PANEL
Cholesterol: 189 mg/dL (ref 0–200)
HDL: 47.2 mg/dL (ref >39.00)
LDL Cholesterol: 110 mg/dL — ABNORMAL HIGH (ref 0–99)
NonHDL: 142.29
Total CHOL/HDL Ratio: 4
Triglycerides: 162 mg/dL — ABNORMAL HIGH (ref 0.0–149.0)
VLDL: 32.4 mg/dL (ref 0.0–40.0)

## 2022-11-06 LAB — TSH: TSH: 3.1 u[IU]/mL (ref 0.35–5.50)

## 2022-12-18 ENCOUNTER — Other Ambulatory Visit: Payer: Self-pay | Admitting: Family Medicine

## 2022-12-18 DIAGNOSIS — E559 Vitamin D deficiency, unspecified: Secondary | ICD-10-CM

## 2022-12-18 NOTE — Telephone Encounter (Signed)
Chart supports rx. Last OV: 11/03/2022

## 2023-01-30 ENCOUNTER — Inpatient Hospital Stay (HOSPITAL_COMMUNITY): Payer: 59

## 2023-01-30 ENCOUNTER — Emergency Department (HOSPITAL_COMMUNITY): Payer: 59

## 2023-01-30 ENCOUNTER — Telehealth: Payer: Self-pay | Admitting: Neurology

## 2023-01-30 ENCOUNTER — Encounter (HOSPITAL_COMMUNITY): Payer: Self-pay | Admitting: Pulmonary Disease

## 2023-01-30 ENCOUNTER — Telehealth: Payer: Self-pay | Admitting: Family Medicine

## 2023-01-30 ENCOUNTER — Inpatient Hospital Stay (HOSPITAL_COMMUNITY)
Admission: EM | Admit: 2023-01-30 | Discharge: 2023-02-05 | DRG: 100 | Disposition: A | Discharge status: Home / Self Care | Payer: 59 | Attending: Family Medicine | Admitting: Family Medicine

## 2023-01-30 ENCOUNTER — Emergency Department (HOSPITAL_COMMUNITY)
Admission: EM | Admit: 2023-01-30 | Discharge: 2023-01-30 | Disposition: A | Discharge status: Home / Self Care | Payer: 59 | Source: Home / Self Care | Attending: Emergency Medicine | Admitting: Emergency Medicine

## 2023-01-30 ENCOUNTER — Encounter (HOSPITAL_COMMUNITY): Payer: Self-pay

## 2023-01-30 ENCOUNTER — Other Ambulatory Visit: Payer: Self-pay

## 2023-01-30 DIAGNOSIS — M6282 Rhabdomyolysis: Secondary | ICD-10-CM | POA: Diagnosis present

## 2023-01-30 DIAGNOSIS — T17908A Unspecified foreign body in respiratory tract, part unspecified causing other injury, initial encounter: Secondary | ICD-10-CM | POA: Diagnosis not present

## 2023-01-30 DIAGNOSIS — J690 Pneumonitis due to inhalation of food and vomit: Secondary | ICD-10-CM | POA: Diagnosis present

## 2023-01-30 DIAGNOSIS — E162 Hypoglycemia, unspecified: Secondary | ICD-10-CM | POA: Diagnosis not present

## 2023-01-30 DIAGNOSIS — X58XXXA Exposure to other specified factors, initial encounter: Secondary | ICD-10-CM | POA: Insufficient documentation

## 2023-01-30 DIAGNOSIS — F909 Attention-deficit hyperactivity disorder, unspecified type: Secondary | ICD-10-CM | POA: Diagnosis present

## 2023-01-30 DIAGNOSIS — B954 Other streptococcus as the cause of diseases classified elsewhere: Secondary | ICD-10-CM | POA: Diagnosis present

## 2023-01-30 DIAGNOSIS — Z8249 Family history of ischemic heart disease and other diseases of the circulatory system: Secondary | ICD-10-CM | POA: Diagnosis not present

## 2023-01-30 DIAGNOSIS — E872 Acidosis, unspecified: Secondary | ICD-10-CM | POA: Diagnosis present

## 2023-01-30 DIAGNOSIS — N179 Acute kidney failure, unspecified: Secondary | ICD-10-CM | POA: Diagnosis present

## 2023-01-30 DIAGNOSIS — G9349 Other encephalopathy: Secondary | ICD-10-CM | POA: Diagnosis present

## 2023-01-30 DIAGNOSIS — D649 Anemia, unspecified: Secondary | ICD-10-CM | POA: Diagnosis present

## 2023-01-30 DIAGNOSIS — S00532A Contusion of oral cavity, initial encounter: Secondary | ICD-10-CM | POA: Insufficient documentation

## 2023-01-30 DIAGNOSIS — E860 Dehydration: Secondary | ICD-10-CM | POA: Diagnosis present

## 2023-01-30 DIAGNOSIS — Z79899 Other long term (current) drug therapy: Secondary | ICD-10-CM

## 2023-01-30 DIAGNOSIS — R569 Unspecified convulsions: Secondary | ICD-10-CM

## 2023-01-30 DIAGNOSIS — G9381 Temporal sclerosis: Secondary | ICD-10-CM | POA: Diagnosis present

## 2023-01-30 DIAGNOSIS — R339 Retention of urine, unspecified: Secondary | ICD-10-CM | POA: Diagnosis not present

## 2023-01-30 DIAGNOSIS — F17210 Nicotine dependence, cigarettes, uncomplicated: Secondary | ICD-10-CM | POA: Diagnosis not present

## 2023-01-30 DIAGNOSIS — G40901 Epilepsy, unspecified, not intractable, with status epilepticus: Secondary | ICD-10-CM | POA: Diagnosis present

## 2023-01-30 DIAGNOSIS — J9601 Acute respiratory failure with hypoxia: Secondary | ICD-10-CM

## 2023-01-30 DIAGNOSIS — F17220 Nicotine dependence, chewing tobacco, uncomplicated: Secondary | ICD-10-CM | POA: Diagnosis not present

## 2023-01-30 LAB — POCT I-STAT 7, (LYTES, BLD GAS, ICA,H+H)
Acid-base deficit: 5 mmol/L — ABNORMAL HIGH (ref 0.0–2.0)
Bicarbonate: 18.6 mmol/L — ABNORMAL LOW (ref 20.0–28.0)
Calcium, Ion: 1.2 mmol/L (ref 1.15–1.40)
HCT: 41 % (ref 39.0–52.0)
Hemoglobin: 13.9 g/dL (ref 13.0–17.0)
O2 Saturation: 100 %
Patient temperature: 98.6
Potassium: 3.7 mmol/L (ref 3.5–5.1)
Sodium: 137 mmol/L (ref 135–145)
TCO2: 20 mmol/L — ABNORMAL LOW (ref 22–32)
pCO2 arterial: 30.9 mmHg — ABNORMAL LOW (ref 32–48)
pH, Arterial: 7.389 (ref 7.35–7.45)
pO2, Arterial: 388 mmHg — ABNORMAL HIGH (ref 83–108)

## 2023-01-30 LAB — COMPREHENSIVE METABOLIC PANEL
ALT: 31 U/L (ref 0–44)
AST: 52 U/L — ABNORMAL HIGH (ref 15–41)
Albumin: 5 g/dL (ref 3.5–5.0)
Alkaline Phosphatase: 54 U/L (ref 38–126)
Anion gap: 17 — ABNORMAL HIGH (ref 5–15)
BUN: 13 mg/dL (ref 6–20)
CO2: 15 mmol/L — ABNORMAL LOW (ref 22–32)
Calcium: 9.1 mg/dL (ref 8.9–10.3)
Chloride: 105 mmol/L (ref 98–111)
Creatinine, Ser: 1.21 mg/dL (ref 0.61–1.24)
GFR, Estimated: >60 mL/min (ref >60)
Glucose, Bld: 122 mg/dL — ABNORMAL HIGH (ref 70–99)
Potassium: 3.8 mmol/L (ref 3.5–5.1)
Sodium: 137 mmol/L (ref 135–145)
Total Bilirubin: 0.9 mg/dL (ref 0.3–1.2)
Total Protein: 8.7 g/dL — ABNORMAL HIGH (ref 6.5–8.1)

## 2023-01-30 LAB — CBC WITH DIFFERENTIAL/PLATELET
Abs Immature Granulocytes: 0.05 10*3/uL (ref 0.00–0.07)
Abs Immature Granulocytes: 0.14 10*3/uL — ABNORMAL HIGH (ref 0.00–0.07)
Basophils Absolute: 0 10*3/uL (ref 0.0–0.1)
Basophils Absolute: 0.1 10*3/uL (ref 0.0–0.1)
Basophils Relative: 0 %
Basophils Relative: 0 %
Eosinophils Absolute: 0.1 10*3/uL (ref 0.0–0.5)
Eosinophils Absolute: 0 10*3/uL (ref 0.0–0.5)
Eosinophils Relative: 1 %
Eosinophils Relative: 0 %
HCT: 44.7 % (ref 39.0–52.0)
HCT: 53.1 % — ABNORMAL HIGH (ref 39.0–52.0)
Hemoglobin: 15.2 g/dL (ref 13.0–17.0)
Hemoglobin: 16.8 g/dL (ref 13.0–17.0)
Immature Granulocytes: 1 %
Immature Granulocytes: 1 %
Lymphocytes Relative: 21 %
Lymphocytes Relative: 15 %
Lymphs Abs: 2.1 10*3/uL (ref 0.7–4.0)
Lymphs Abs: 3 10*3/uL (ref 0.7–4.0)
MCH: 29.1 pg (ref 26.0–34.0)
MCH: 29 pg (ref 26.0–34.0)
MCHC: 34 g/dL (ref 30.0–36.0)
MCHC: 31.6 g/dL (ref 30.0–36.0)
MCV: 85.5 fL (ref 80.0–100.0)
MCV: 91.6 fL (ref 80.0–100.0)
Monocytes Absolute: 0.7 10*3/uL (ref 0.1–1.0)
Monocytes Absolute: 1.8 10*3/uL — ABNORMAL HIGH (ref 0.1–1.0)
Monocytes Relative: 7 %
Monocytes Relative: 9 %
Neutro Abs: 7.1 10*3/uL (ref 1.7–7.7)
Neutro Abs: 15.4 10*3/uL — ABNORMAL HIGH (ref 1.7–7.7)
Neutrophils Relative %: 70 %
Neutrophils Relative %: 75 %
Platelets: 212 10*3/uL (ref 150–400)
Platelets: 251 10*3/uL (ref 150–400)
RBC: 5.23 MIL/uL (ref 4.22–5.81)
RBC: 5.8 MIL/uL (ref 4.22–5.81)
RDW: 11.7 % (ref 11.5–15.5)
RDW: 11.9 % (ref 11.5–15.5)
WBC: 10.1 10*3/uL (ref 4.0–10.5)
WBC: 20.5 10*3/uL — ABNORMAL HIGH (ref 4.0–10.5)
nRBC: 0 % (ref 0.0–0.2)
nRBC: 0 % (ref 0.0–0.2)

## 2023-01-30 LAB — BASIC METABOLIC PANEL
Anion gap: 11 (ref 5–15)
BUN: 13 mg/dL (ref 6–20)
CO2: 26 mmol/L (ref 22–32)
Calcium: 9.1 mg/dL (ref 8.9–10.3)
Chloride: 101 mmol/L (ref 98–111)
Creatinine, Ser: 1.09 mg/dL (ref 0.61–1.24)
GFR, Estimated: >60 mL/min (ref >60)
Glucose, Bld: 118 mg/dL — ABNORMAL HIGH (ref 70–99)
Potassium: 3.5 mmol/L (ref 3.5–5.1)
Sodium: 138 mmol/L (ref 135–145)

## 2023-01-30 LAB — GLUCOSE, CAPILLARY
Glucose-Capillary: 103 mg/dL — ABNORMAL HIGH (ref 70–99)
Glucose-Capillary: 114 mg/dL — ABNORMAL HIGH (ref 70–99)
Glucose-Capillary: 53 mg/dL — ABNORMAL LOW (ref 70–99)
Glucose-Capillary: 61 mg/dL — ABNORMAL LOW (ref 70–99)
Glucose-Capillary: 75 mg/dL (ref 70–99)

## 2023-01-30 LAB — CREATININE, SERUM
Creatinine, Ser: 1.37 mg/dL — ABNORMAL HIGH (ref 0.61–1.24)
GFR, Estimated: >60 mL/min (ref >60)

## 2023-01-30 LAB — BLOOD GAS, VENOUS
Acid-base deficit: 5.9 mmol/L — ABNORMAL HIGH (ref 0.0–2.0)
Bicarbonate: 18.8 mmol/L — ABNORMAL LOW (ref 20.0–28.0)
O2 Saturation: 81.5 %
Patient temperature: 37
pCO2, Ven: 34 mmHg — ABNORMAL LOW (ref 44–60)
pH, Ven: 7.35 (ref 7.25–7.43)
pO2, Ven: 46 mmHg — ABNORMAL HIGH (ref 32–45)

## 2023-01-30 LAB — CBG MONITORING, ED
Glucose-Capillary: 94 mg/dL (ref 70–99)
Glucose-Capillary: 129 mg/dL — ABNORMAL HIGH (ref 70–99)

## 2023-01-30 LAB — CBC
HCT: 43.7 % (ref 39.0–52.0)
Hemoglobin: 14.9 g/dL (ref 13.0–17.0)
MCH: 29.1 pg (ref 26.0–34.0)
MCHC: 34.1 g/dL (ref 30.0–36.0)
MCV: 85.4 fL (ref 80.0–100.0)
Platelets: 228 10*3/uL (ref 150–400)
RBC: 5.12 MIL/uL (ref 4.22–5.81)
RDW: 11.9 % (ref 11.5–15.5)
WBC: 14.9 10*3/uL — ABNORMAL HIGH (ref 4.0–10.5)
nRBC: 0 % (ref 0.0–0.2)

## 2023-01-30 LAB — LACTIC ACID, PLASMA: Lactic Acid, Venous: 3.8 mmol/L (ref 0.5–1.9)

## 2023-01-30 LAB — HIV ANTIBODY (ROUTINE TESTING W REFLEX): HIV Screen 4th Generation wRfx: NONREACTIVE

## 2023-01-30 LAB — CK: Total CK: 1465 U/L — ABNORMAL HIGH (ref 49–397)

## 2023-01-30 LAB — MAGNESIUM: Magnesium: 2.5 mg/dL — ABNORMAL HIGH (ref 1.7–2.4)

## 2023-01-30 LAB — MRSA NEXT GEN BY PCR, NASAL: MRSA by PCR Next Gen: NOT DETECTED

## 2023-01-30 MED ORDER — LEVETIRACETAM IN NACL 500 MG/100ML IV SOLN
500.0000 mg | Freq: Two times a day (BID) | INTRAVENOUS | Status: DC
  Administered 2023-01-30 – 2023-02-01 (×5): 500 mg via INTRAVENOUS
  Filled 2023-01-30 (×5): qty 100

## 2023-01-30 MED ORDER — LORAZEPAM 2 MG/ML IJ SOLN: 4.0000 mg | Freq: Once | INTRAMUSCULAR | Status: AC

## 2023-01-30 MED ORDER — LORAZEPAM 2 MG/ML IJ SOLN
2.0000 mg | Freq: Once | INTRAMUSCULAR | Status: AC
  Administered 2023-01-30: 2 mg via INTRAVENOUS

## 2023-01-30 MED ORDER — PROPOFOL 10 MG/ML IV BOLUS
20.0000 mg | Freq: Once | INTRAVENOUS | Status: AC
  Administered 2023-01-30: 20 mg via INTRAVENOUS

## 2023-01-30 MED ORDER — MIDAZOLAM-SODIUM CHLORIDE 100-0.9 MG/100ML-% IV SOLN
2.0000 mg/h | INTRAVENOUS | Status: DC
  Administered 2023-01-30: 2 mg/h via INTRAVENOUS
  Filled 2023-01-30: qty 100

## 2023-01-30 MED ORDER — ROCURONIUM BROMIDE 10 MG/ML (PF) SYRINGE
PREFILLED_SYRINGE | INTRAVENOUS | Status: AC
  Administered 2023-01-30: 100 mg
  Filled 2023-01-30: qty 10

## 2023-01-30 MED ORDER — ORAL CARE MOUTH RINSE
15.0000 mL | OROMUCOSAL | Status: DC
  Administered 2023-01-30 – 2023-02-02 (×34): 15 mL via OROMUCOSAL

## 2023-01-30 MED ORDER — POLYETHYLENE GLYCOL 3350 17 G PO PACK: 17.0000 g | PACK | Freq: Every day | ORAL | Status: DC | PRN

## 2023-01-30 MED ORDER — CHLORHEXIDINE GLUCONATE CLOTH 2 % EX PADS
6.0000 | MEDICATED_PAD | Freq: Every day | CUTANEOUS | Status: DC
  Administered 2023-01-31 – 2023-02-04 (×5): 6 via TOPICAL

## 2023-01-30 MED ORDER — ENOXAPARIN SODIUM 40 MG/0.4ML IJ SOSY
40.0000 mg | PREFILLED_SYRINGE | INTRAMUSCULAR | Status: DC
  Administered 2023-01-30 – 2023-02-05 (×7): 40 mg via SUBCUTANEOUS
  Filled 2023-01-30 (×7): qty 0.4

## 2023-01-30 MED ORDER — LORAZEPAM 2 MG/ML IJ SOLN
INTRAMUSCULAR | Status: AC
  Filled 2023-01-30: qty 1

## 2023-01-30 MED ORDER — FAMOTIDINE 20 MG PO TABS
20.0000 mg | ORAL_TABLET | Freq: Two times a day (BID) | ORAL | Status: DC
  Administered 2023-01-30 – 2023-02-01 (×6): 20 mg
  Filled 2023-01-30 (×6): qty 1

## 2023-01-30 MED ORDER — PROPOFOL 1000 MG/100ML IV EMUL
5.0000 ug/kg/min | INTRAVENOUS | Status: DC
  Administered 2023-01-30 – 2023-01-31 (×7): 80 ug/kg/min via INTRAVENOUS
  Administered 2023-01-31: 70 ug/kg/min via INTRAVENOUS
  Administered 2023-01-31 (×3): 80 ug/kg/min via INTRAVENOUS
  Administered 2023-01-31: 20 ug/kg/min via INTRAVENOUS
  Administered 2023-02-01: 5 ug/kg/min via INTRAVENOUS
  Filled 2023-01-30: qty 100
  Filled 2023-01-30 (×2): qty 200
  Filled 2023-01-30 (×3): qty 100
  Filled 2023-01-30: qty 400
  Filled 2023-01-30 (×2): qty 100

## 2023-01-30 MED ORDER — PROPOFOL BOLUS VIA INFUSION
20.0000 mg | Freq: Once | INTRAVENOUS | Status: AC
  Administered 2023-01-30: 20 mg via INTRAVENOUS
  Filled 2023-01-30: qty 20

## 2023-01-30 MED ORDER — LACTATED RINGERS IV SOLN: INTRAVENOUS | Status: DC

## 2023-01-30 MED ORDER — FENTANYL 2500MCG IN NS 250ML (10MCG/ML) PREMIX INFUSION
0.0000 ug/h | INTRAVENOUS | Status: DC
  Administered 2023-01-30: 25 ug/h via INTRAVENOUS
  Administered 2023-01-30: 200 ug/h via INTRAVENOUS
  Administered 2023-01-31: 100 ug/h via INTRAVENOUS
  Administered 2023-01-31 – 2023-02-01 (×2): 200 ug/h via INTRAVENOUS
  Administered 2023-02-02: 100 ug/h via INTRAVENOUS
  Filled 2023-01-30 (×4): qty 250

## 2023-01-30 MED ORDER — LAMOTRIGINE 100 MG PO TABS
100.0000 mg | ORAL_TABLET | Freq: Two times a day (BID) | ORAL | Status: DC
  Administered 2023-01-30 – 2023-02-01 (×5): 100 mg
  Filled 2023-01-30 (×8): qty 1

## 2023-01-30 MED ORDER — LORAZEPAM 2 MG/ML IJ SOLN
INTRAMUSCULAR | Status: AC
  Administered 2023-01-30: 4 mg
  Filled 2023-01-30: qty 2

## 2023-01-30 MED ORDER — ETOMIDATE 2 MG/ML IV SOLN
INTRAVENOUS | Status: AC
  Administered 2023-01-30: 20 mg
  Filled 2023-01-30: qty 20

## 2023-01-30 MED ORDER — INSULIN ASPART 100 UNIT/ML IJ SOLN: 2.0000 [IU] | INTRAMUSCULAR | Status: DC

## 2023-01-30 MED ORDER — PROPOFOL 1000 MG/100ML IV EMUL
INTRAVENOUS | Status: AC
  Administered 2023-01-30: 5 ug/kg/min via INTRAVENOUS
  Filled 2023-01-30: qty 100

## 2023-01-30 MED ORDER — DOCUSATE SODIUM 100 MG PO CAPS: 100.0000 mg | ORAL_CAPSULE | Freq: Two times a day (BID) | ORAL | Status: DC | PRN

## 2023-01-30 MED ORDER — SODIUM CHLORIDE 0.9 % IV SOLN
40.0000 mg/kg | Freq: Once | INTRAVENOUS | Status: AC
  Administered 2023-01-30: 4120 mg via INTRAVENOUS
  Filled 2023-01-30: qty 41.2

## 2023-01-30 MED ORDER — LORAZEPAM 2 MG/ML IJ SOLN
3.0000 mg | Freq: Once | INTRAMUSCULAR | Status: AC
  Administered 2023-01-30: 3 mg via INTRAVENOUS
  Filled 2023-01-30: qty 2

## 2023-01-30 MED ORDER — MIDAZOLAM-SODIUM CHLORIDE 100-0.9 MG/100ML-% IV SOLN: 1.0000 mg/h | INTRAVENOUS | Status: DC

## 2023-01-30 MED ORDER — ROCURONIUM BROMIDE 10 MG/ML (PF) SYRINGE
PREFILLED_SYRINGE | INTRAVENOUS | Status: AC
  Filled 2023-01-30: qty 10

## 2023-01-30 MED ORDER — DEXTROSE 50 % IV SOLN
12.5000 g | INTRAVENOUS | Status: AC
  Administered 2023-01-30: 12.5 g via INTRAVENOUS
  Filled 2023-01-30: qty 50

## 2023-01-30 MED ORDER — MIDAZOLAM BOLUS VIA INFUSION: 2.0000 mg | INTRAVENOUS | Status: DC | PRN

## 2023-01-30 MED ORDER — DEXTROSE IN LACTATED RINGERS 5 % IV SOLN: INTRAVENOUS | Status: DC

## 2023-01-30 MED ORDER — ORAL CARE MOUTH RINSE: 15.0000 mL | OROMUCOSAL | Status: DC | PRN

## 2023-01-30 NOTE — Progress Notes (Signed)
LTM EEG running - no initial skin breakdown - push button tested. [facility] notified.  

## 2023-01-30 NOTE — ED Provider Notes (Signed)
[place] EMERGENCY DEPARTMENT AT [facility] Provider Note   CSN: [id number] Arrival date & time: 01/30/23  4098     History  Chief Complaint  Patient presents with   Seizures    Joseph Cameron is a 32 y.o. male with seizures, ADHD presents with seizures x2.   Per chart review patient was seen overnight last night here in the ED for seizure activity.  He does have a history of seizures and takes Lamictal.  He had an episode of seizure activity during sleeping noted by his wife.  Had right distal tongue contusion consistent with biting during seizure.  Lasted less than 1 minute before resolving spontaneously.  Labs including glucose, BMP, CBC were unremarkable.  He was observed for several hours in the ED without any recurrence of seizure-like activity and Lamictal level was drawn.  Wife was planning on calling neurologist this morning in order to schedule follow-up appointment as seizures do appear to be becoming more frequent recently.   Called into room shortly after patient's arrival as he was actively seizing, started at 1017. Fiancee at bedside reports that he has had 2 further similar seizure-like episodes each lasting "a few minutes" that occurred while he was laying in bed.  One was not 0720 and one was at 0830 this morning.  He has not returned to baseline mental status since the first seizure.  He has vomited and also urinated on himself twice.  Neysa Bonito turned him on his side and called 911.  Further history limited by acuity of situation.   Seizures      Home Medications Prior to Admission medications   Medication Sig Start Date End Date Taking? Authorizing Provider  acetaminophen (TYLENOL) 325 MG tablet Take 650 mg by mouth every 6 (six) hours as needed for moderate pain.   Yes [provider]  diazePAM, 15 MG Dose, (VALTOCO 15 MG DOSE) 2 x 7.5 MG/0.1ML LQPK Place 7.5 mg into the nose as needed (For seizure lasting more than 2 minutes).  08/03/22  Yes Windell Norfolk, MD  lamoTRIgine (LAMICTAL XR) 50 MG 24 hour tablet Take 3 tablets (150 mg total) by mouth daily. 10/26/22 10/21/23 Yes Windell Norfolk, MD  Vitamin D, Ergocalciferol, (DRISDOL) 1.25 MG (50000 UNIT) CAPS capsule TAKE 1 CAPSULE BY MOUTH EVERY 7 DAYS 12/18/22  Yes Garnette Gunner, MD      Allergies    Patient has no known allergies.    Review of Systems   Review of Systems  Unable to perform ROS: Patient unresponsive  Neurological:  Positive for seizures.    Physical Exam Updated Vital Signs BP 110/80 (BP Location: Left Arm)   Pulse 99   Temp 98.4 F (36.9 C) (Oral)   Resp 18   Wt 103 kg   SpO2 98%   BMI 27.88 kg/m  Physical Exam General: Obtunded male, lying in bed, clonic activity in BL LEs.  HEENT: PERRLA, Sclera anicteric, trachea midline. Vomitus in airway, bloody secretions.  Cardiology: Regular tachycardic rate, no murmurs/rubs/gallops. BL radial and DP pulses equal bilaterally.  Resp:Tachypneic RR with increased effort, vomitus in airway, snoring respirations  Abd: Soft, non-tender, non-distended. No rebound tenderness or guarding.  GU: Deferred. MSK: No peripheral edema or signs of trauma. Extremities without deformity or TTP. No cyanosis or clubbing. Skin: warm, dry.  Neuro: Obtunded, nonverbal/unresponsive, initially actively seizing GCS 3, after ativan GCS 7 (E1V1M5), CNs II-XII grossly intact, MAEs.   ED Results / Procedures / Treatments   Labs (  all labs ordered are listed, but only abnormal results are displayed) Labs Reviewed  CBC WITH DIFFERENTIAL/PLATELET - Abnormal; Notable for the following components:      Result Value   WBC 20.5 (*)    HCT 53.1 (*)    Neutro Abs 15.4 (*)    Monocytes Absolute 1.8 (*)    Abs Immature Granulocytes 0.14 (*)    All other components within normal limits  CBG MONITORING, ED - Abnormal; Notable for the following components:   Glucose-Capillary 129 (*)    All other components within normal limits   COMPREHENSIVE METABOLIC PANEL  LACTIC ACID, PLASMA  LACTIC ACID, PLASMA  CK  MAGNESIUM  BLOOD GAS, ARTERIAL    EKG EKG Interpretation  Date/Time:  Tuesday January 30 2023 11:07:22 EDT Ventricular Rate:  104 PR Interval:  173 QRS Duration: 102 QT Interval:  350 QTC Calculation: 461 R Axis:   95 Text Interpretation: Sinus tachycardia Borderline right axis deviation ST elev, probable normal early repol pattern , similar to prior Confirmed by Vivi Barrack [phone]) on 01/30/2023 11:25:02 AM  Radiology DG Chest Portable 1 View  Result Date: 01/30/2023 CLINICAL DATA:  Intubation EXAM: PORTABLE CHEST 1 VIEW, portable abdominal x-ray for tube placement COMPARISON:  Chest x-ray 10/17/2022 FINDINGS: New ET tube seen with tip proximally 5 cm above the carina. Enteric tube in place with tip overlying the fundus of the stomach. Subtle opacity left lung base. Infiltrate is possible. No pneumothorax, effusion or edema. Normal cardiopericardial silhouette. Overlapping cardiac leads. Gas seen along nondilated loops of bowel in the visualized upper abdomen elsewhere. IMPRESSION: ET tube and enteric tube in place. Subtle left lung base opacity.  Recommend follow-up Electronically Signed   By: Karen Kays M.D.   On: 01/30/2023 11:09   DG Abdomen 1 View  Result Date: 01/30/2023 CLINICAL DATA:  Intubation EXAM: PORTABLE CHEST 1 VIEW, portable abdominal x-ray for tube placement COMPARISON:  Chest x-ray 10/17/2022 FINDINGS: New ET tube seen with tip proximally 5 cm above the carina. Enteric tube in place with tip overlying the fundus of the stomach. Subtle opacity left lung base. Infiltrate is possible. No pneumothorax, effusion or edema. Normal cardiopericardial silhouette. Overlapping cardiac leads. Gas seen along nondilated loops of bowel in the visualized upper abdomen elsewhere. IMPRESSION: ET tube and enteric tube in place. Subtle left lung base opacity.  Recommend follow-up Electronically Signed   By: Karen Kays M.D.   On: 01/30/2023 11:09    Procedures .Critical Care  Performed by: Loetta Rough, MD Authorized by: Loetta Rough, MD   Critical care provider statement:    Critical care time (minutes):  60   Critical care was necessary to treat or prevent imminent or life-threatening deterioration of the following conditions:  CNS failure or compromise and respiratory failure   Critical care was time spent personally by me on the following activities:  Development of treatment plan with patient or surrogate, discussions with consultants, evaluation of patient's response to treatment, examination of patient, ordering and review of laboratory studies, ordering and review of radiographic studies, ordering and performing treatments and interventions, pulse oximetry, re-evaluation of patient's condition, review of old charts, obtaining history from patient or surrogate and ventilator management   Care discussed with: admitting provider   Procedure Name: Intubation Date/Time: 01/30/2023 11:08 AM  Performed by: Loetta Rough, MDPre-anesthesia Checklist: Patient identified, Patient being monitored, Emergency Drugs available, Timeout performed and Suction available Oxygen Delivery Method: Nasal cannula Preoxygenation: Pre-oxygenation with 100% oxygen  Induction Type: Rapid sequence Ventilation: Mask ventilation with difficulty Laryngoscope Size: 4 and Glidescope Grade View: Grade I Tube size: 7.5 mm Number of attempts: 1 Airway Equipment and Method: Video-laryngoscopy Placement Confirmation: ETT inserted through vocal cords under direct vision, CO2 detector, Breath sounds checked- equal and bilateral and Positive ETCO2 Secured at: 25 cm Tube secured with: ETT holder Dental Injury: Teeth and Oropharynx as per pre-operative assessment  Difficulty Due To: Difficulty was unanticipated Comments: Patient with tongue contusion from prior seizure activity, as per pre-intubation assessment         Medications Ordered in ED Medications  rocuronium bromide 100 MG/10ML SOSY (has no administration in time range)  propofol (DIPRIVAN) 1000 MG/100ML infusion (15 mcg/kg/min  103 kg Intravenous Rate/Dose Change 01/30/23 1128)  fentaNYL in NS (62mcg/ml) infusion-PREMIX (50 mcg/hr Intravenous Rate/Dose Change 01/30/23 1130)  levETIRAcetam (KEPPRA) 4,120 mg in sodium chloride 0.9 % 250 mL IVPB (4,120 mg Intravenous New Bag/Given 01/30/23 1134)  LORazepam (ATIVAN) injection 4 mg (4 mg Intravenous Given 01/30/23 1030)  etomidate (AMIDATE) 2 MG/ML injection (20 mg  Given 01/30/23 1040)  rocuronium bromide 100 MG/10ML SOSY (100 mg  Given 01/30/23 1040)  propofol (DIPRIVAN) bolus via infusion 20 mg (20 mg Intravenous Bolus from Bag 01/30/23 1106)    ED Course/ Medical Decision Making/ A&P                          Medical Decision Making Amount and/or Complexity of Data Reviewed Labs: ordered. Decision-making details documented in ED Course. Radiology: ordered. Decision-making details documented in ED Course.  Risk Prescription drug management. Decision regarding hospitalization.    This patient presents to the ED for concern of seizure activity and AMS, this involves an extensive number of treatment options, and is a complaint that carries with it a high risk of complications and morbidity.  I considered the following differential and admission for this acute, potentially life threatening condition.   MDM:    Patient is seizing on my assessment, he is observed to have blood secretions and snoring respirations with moderate respiratory distress. BP stable to hypertensive, mild sinus tachycardia HR. Satting 77% on RA, placed on 8L [place], satting 95% after [place]. Patient's seizure started at 1017 per fiancee at bedside.  Seizure continued and after several minutes patient was given 2 mg IV Ativan, seizure continued, given an additional 2 mg of IV Ativan and the seizure stopped.  Patient  began to respond to noxious stimuli and localize pain with his bilateral upper extremities.  However patient continued to have depressed mental status, GCS 7, with inability to control his airway and requiring jaw thrust with snoring respirations and tachypnea/hypoxia requiring 8 L nasal cannula. Patient continuing to not become more alert. Decision made to intubate patient for altered mental status.  Patient moved to recess and intubated with RSI etomidate/rocuronium with 7.5 tube, MAC 4 glidescope, successfully in one pass. Patient will be started on fentanyl/propofol, bolus keppra 40 mg/kg IV, and EEG ordered. Will consult to neurology/ICU.  Causes of seizures include:  Patient with known h/o underlying epilepsy presents in status epilepticus with 3 seizures actively seizing on arrival without return to BL mental status. Fiancee at bedside states this has never happened before. Compliant with lamictal at home. Consider intracranial hemorrhage, brain tumor or abscess, head injury or TBI, hypoxic-ischemic injury, vascular malformation such as AVM, acute hydrocephalus.  Lower c/f meningitis/encephalitis given afebrile otherwise in [facility]. Lower c/f alcohol/drug withdrawal,  toxic ingestion. Also consider electrolyte abnormalities (hyponatremia/hypernatremia, hypomagnesemia, hypocalcemia), hypoglycemia, uremia.  Patient's fiancee updated at bedside.  Clinical Course as of 01/30/23 1443  Tue Jan 30, 2023  1112 Glucose-Capillary(!): 129 [HN]  1113 DG Chest Portable 1 View ET tube and enteric tube in place.  Subtle left lung base opacity.  Recommend follow-up   [HN]  1125 WBC(!): 20.5 Likely d/t seizure activity/acute stress response [HN]  1140 Consulted neurology who recommends continuous EEG monitoring and transfer to ICU [place].  Consult to critical care who accepted patient for admission at [facility] ICU. [HN]  1213 CK Total(!): 1,465 S/p 1L NS, will give additional fluids [HN]    Clinical Course  User Index [HN] Loetta Rough, MD    Labs: I Ordered, and personally interpreted labs.  The pertinent results include:  those listed above  Imaging Studies ordered: I ordered imaging studies including CTH I independently visualized and interpreted imaging. I agree with the radiologist interpretation  Additional history obtained from chart review, fiancee at bedside.    Cardiac Monitoring: The patient was maintained on a cardiac monitor.  I personally viewed and interpreted the cardiac monitored which showed an underlying rhythm of: sinus tachycardia, normal sinus rhythm  Reevaluation: After the interventions noted above, I reevaluated the patient and found that they have :improved  Social Determinants of Health: Patient lives independently   Disposition:  No emergent or reversible cause of seizures is identified on labs/imaging. Does have some component of rhabdomyolysis, is receiving IVF. Patient requiring higher doses of sedation after several hours intubated, possible that he is beginning to arouse. Patient admitted to ICU for further w/u, management, and EEG monitoring.    Co morbidities that complicate the patient evaluation  Past Medical History:  Diagnosis Date   Acute tonsillitis 04/07/2013   Seizures    SYNCOPE, VASOVAGAL 07/05/2009   Qualifier: Diagnosis of   By: Amador Cunas  MD, Janett Labella        Medicines Meds ordered this encounter  Medications   LORazepam (ATIVAN) 2 MG/ML injection    Duwaine Maxin: cabinet override   LORazepam (ATIVAN) injection 4 mg   etomidate (AMIDATE) 2 MG/ML injection    Savoie, Brianna N: cabinet override   rocuronium bromide 100 MG/10ML SOSY    Savoie, Brianna N: cabinet override   propofol (DIPRIVAN) 1000 MG/100ML infusion    Savoie, Brianna N: cabinet override   rocuronium bromide 100 MG/10ML SOSY    Savoie, Brianna N: cabinet override   propofol (DIPRIVAN) 1000 MG/100ML infusion   propofol (DIPRIVAN) bolus via infusion 20 mg    fentaNYL in NS (27mcg/ml) infusion-PREMIX   levETIRAcetam (KEPPRA) 4,120 mg in sodium chloride 0.9 % 250 mL IVPB    I have reviewed the patients home medicines and have made adjustments as needed  Problem List / ED Course: Problem List Items Addressed This Visit       Nervous and Auditory   * (Principal) Status epilepticus - Primary   Relevant Medications   midazolam (VERSED) 100 mg/100 mL (1 mg/mL) premix infusion   Other Visit Diagnoses     Acute respiratory failure with hypoxia       Non-traumatic rhabdomyolysis                       This note was created using dictation software, which may contain spelling or grammatical errors.    Loetta Rough, MD 01/30/23 [phone]

## 2023-01-30 NOTE — ED Notes (Signed)
[facility] called for transport. 

## 2023-01-30 NOTE — Procedures (Signed)
Patient Name: DAMEION BRILES  MRN: [id number]  Epilepsy Attending: Charlsie Quest  Referring Physician/Provider: Loetta Rough, MD  Date: 01/30/2023 Duration: 22.09 mins  Patient history: 32yo M with status epilepticus, getting eeg to evaluate for seizure.  Level of alertness:  comatose  AEDs during EEG study: Propofol, Versed  Technical aspects: This EEG study was done with scalp electrodes positioned according to the 10-20 International system of electrode placement. Electrical activity was reviewed with band pass filter of 1-70Hz , sensitivity of 7 uV/mm, display speed of 34mm/sec with a  notched filter applied as appropriate. EEG data were recorded continuously and digitally stored.  Video monitoring was available and reviewed as appropriate.  Description: EEG showed burst suppression with burst of generalized sharply contoured 12-13Hz  beta activity lasting 2-3 seconds alternating with generalized suppression lasting 3-4 seconds.  Hyperventilation and photic stimulation were not performed.     ABNORMALITY -Burst suppression, generalized  IMPRESSION: This study is suggestive of profound diffuse encephalopathy likely related to sedation. No seizures or epileptiform discharges were seen throughout the recording.  Daleisa Halperin Annabelle Harman

## 2023-01-30 NOTE — Telephone Encounter (Signed)
Pt's Significant other Julianne called stating that the pt had a seizure this morning at 1am then another at 7:20am then again at 8:30am but with this one he was not very responsive so she called the EMS and was he was admitted. Being in the hospital he had one at 10:17am that lasted 10 min and he was not responsive and had to be intubated. Pt is at [facility] and is in status epilepticus.Marland Kitchen

## 2023-01-30 NOTE — Discharge Instructions (Signed)
Continue your medications as prescribed.  Make sure to rest. Follow-up with your neurologist.  No driving until cleared to do so. Return to the ED for new or worsening symptoms.

## 2023-01-30 NOTE — Progress Notes (Signed)
Report delivered via telephone to ICU RT related to this patient.

## 2023-01-30 NOTE — ED Notes (Signed)
[facility] here to transport pt to [facility]  

## 2023-01-30 NOTE — Telephone Encounter (Signed)
Pt's fiance reports that at 1:00 am pt had a seizure and they went to ED([facility]) came home, pt had another at 7:00 and at 8:30.  Fiance called from car, she is driving pt back to ED.

## 2023-01-30 NOTE — ED Provider Notes (Signed)
[place] EMERGENCY DEPARTMENT AT [facility] Provider Note   CSN: [id number] Arrival date & time: 01/30/23  0202     History  Chief Complaint  Patient presents with   Seizures    Joseph Cameron is a 32 y.o. male.  The history is provided by the patient and medical records.  Seizures  32 year old male with history of seizures, presenting to the ED following seizure that occurred at home.  Wife states she thought he was snoring but looked over at him and jaw was clenched, foaming at the mouth, and he was not responding.  He did not have any tonic-clonic activity.  This lasted <1 minute before resolving spontaneously.  Patient has been compliant with his Lamictal, he did take a dose late 1 day last week but otherwise has been fully compliant.  He has not had any new medications.  Currently he feels back to baseline other than being tired.  Home Medications Prior to Admission medications   Medication Sig Start Date End Date Taking? Authorizing Provider  diazePAM, 15 MG Dose, (VALTOCO 15 MG DOSE) 2 x 7.5 MG/0.1ML LQPK Place 7.5 mg into the nose as needed (For seizure lasting more than 2 minutes). 08/03/22   Windell Norfolk, MD  lamoTRIgine (LAMICTAL XR) 50 MG 24 hour tablet Take 3 tablets (150 mg total) by mouth daily. 10/26/22 10/21/23  Windell Norfolk, MD  Vitamin D, Ergocalciferol, (DRISDOL) 1.25 MG (50000 UNIT) CAPS capsule TAKE 1 CAPSULE BY MOUTH EVERY 7 DAYS 12/18/22   Garnette Gunner, MD      Allergies    Patient has no known allergies.    Review of Systems   Review of Systems  Neurological:  Positive for seizures.  All other systems reviewed and are negative.   Physical Exam Updated Vital Signs BP 115/78   Pulse 86   Temp 99 F (37.2 C) (Oral)   Resp 20   SpO2 100%   Physical Exam Vitals and nursing note reviewed.  Constitutional:      Appearance: He is well-developed.  HENT:     Head: Normocephalic and atraumatic.     Mouth/Throat:      Comments: Contusion distal right tongue, no bleeding or laceration, dentition intact Eyes:     Conjunctiva/sclera: Conjunctivae normal.     Pupils: Pupils are equal, round, and reactive to light.     Comments: PERRL  Cardiovascular:     Rate and Rhythm: Normal rate and regular rhythm.     Heart sounds: Normal heart sounds.  Pulmonary:     Effort: Pulmonary effort is normal.     Breath sounds: Normal breath sounds.  Abdominal:     General: Bowel sounds are normal.     Palpations: Abdomen is soft.  Musculoskeletal:        General: Normal range of motion.     Cervical back: Normal range of motion.  Skin:    General: Skin is warm and dry.  Neurological:     Mental Status: He is alert and oriented to person, place, and time.     Comments: AAOx3, answering questions and following commands appropriately; equal strength UE and LE bilaterally; CN grossly intact; moves all extremities appropriately without ataxia; no focal neuro deficits or facial asymmetry appreciated     ED Results / Procedures / Treatments   Labs (all labs ordered are listed, but only abnormal results are displayed) Labs Reviewed  BASIC METABOLIC PANEL - Abnormal; Notable for the following components:  Result Value   Glucose, Bld 118 (*)    All other components within normal limits  CBC WITH DIFFERENTIAL/PLATELET  LAMOTRIGINE LEVEL  CBG MONITORING, ED    EKG None  Radiology No results found.  Procedures Procedures    Medications Ordered in ED Medications - No data to display  ED Course/ Medical Decision Making/ A&P                             Medical Decision Making Amount and/or Complexity of Data Reviewed Labs: ordered. ECG/medicine tests: ordered.   32 year old man presenting to the ED after seizure.  History of same and has been compliant with medications.  He is awake, alert, oriented.  Does have contusion to distal right tongue consistent with seizure.  Normal dentition.  Neurologic  exam is nonfocal.  EKG without acute ischemic changes.  Labs are reassuring.  Lamotrigine level pending--neurology to follow-up on this.  He has been observed here for few hours without any recurrent seizure activity.  He remains neurologically intact.  Feel he is stable for discharge home.  Wife will call neurologist in the morning, may need medication adjustment as it does appear his seizures have become more frequent recently.  He will remain on driving restrictions as per [facility].  He can return here for any new or acute changes.  Final Clinical Impression(s) / ED Diagnoses Final diagnoses:  Seizure    Rx / DC Orders ED Discharge Orders     None         Garlon Hatchet, PA-C 01/30/23 0458    Nira Conn, MD 01/30/23 [phone]

## 2023-01-30 NOTE — Consult Note (Addendum)
Neurology Consultation    Reason for Consult: Status epilepticus of unknown cause  HISTORY OF PRESENT ILLNESS   HPI  History is obtained from: Chart review  Patient is a 32 year old gentleman with epilepsy since the age of 82 who works as a Teacher, early years/pre and is followed by Dr. Olegario Messier at [facility] neurology. He was last seen 10/26/2022 for epilepsy.  His first seizure was nocturnal and occurred at the age of 17 years.  Prior to this presentation, he has had 5 total seizures in his life.  At time of his January 2024 office visit, he had been seizure-free since July 31, 2022; the seizure that had occurred at that time was after he self discontinued his lamotrigine.  He had a normal head CT at that time and was restarted on lamotrigine, which he has been compliant with since then. Current lamotrigine dose is 150 mg daily.  He presented to the emergency room on 01/30/2023 at 3 AM after patient's wife noted a nocturnal seizure described as "foaming at the mouth, snoring, jaw clenching".  This lasted less than a minute before it spontaneously resolved. He confirmed that he was compliant with Lamictal aside from taking a dose late about 7 days prior.  His examination had returned back to his baseline at the time that he was initially evaluated. After observation, he was sent home.  He then had 3 more seizures at home, but did not return to baseline, and thus was brought back to the emergency room. Each of his seizures were preceded by eyes looking forward in fixed gaze and tongue chewing movements as described by fiance. In years past, he did experience stereotyped prodrome of epigastric rising sensation, but not with the current seizures. He also usually has an approximately 24 hour period of malaise preceding seizures, which did not occur with this most recent presentation.  He is transferred from [facility] to [facility] for higher level of care to include long-term EEG monitoring for status epilepticus.   Neurology is consulted for the same.  Patient is now intubated and sedated on fentanyl, propofol at a rate of 80 and Versed at a rate of 2. He is status post 10 mg Ativan, as well as a 4120 mg Keppra load.  ROS: Unable to obtain due to altered mental status.   PAST MEDICAL HISTORY    Past Medical History:  Past Medical History:  Diagnosis Date   Acute tonsillitis 04/07/2013   Seizures    SYNCOPE, VASOVAGAL 07/05/2009   Qualifier: Diagnosis of   By: Amador Cunas  MD, Janett Labella       Family History  Problem Relation Age of Onset   Hyperlipidemia Mother    Diabetes Mother    Hypertension Father    Seizures Maternal Aunt    Diabetes Maternal Grandmother    Leukemia Maternal Grandmother    Diabetes Maternal Grandfather    Hypertension Paternal Grandmother    Hypertension Paternal Grandfather     Allergies:  No Known Allergies  Social History:   reports that he has been smoking. He uses smokeless tobacco. He reports current alcohol use of about 1.0 standard drink of alcohol per week. He reports that he does not use drugs.    Medications Medications Prior to Admission  Medication Sig Dispense Refill   acetaminophen (TYLENOL) 325 MG tablet Take 650 mg by mouth every 6 (six) hours as needed for moderate pain.     diazePAM, 15 MG Dose, (VALTOCO 15 MG DOSE) 2 x 7.5  MG/0.1ML LQPK Place 7.5 mg into the nose as needed (For seizure lasting more than 2 minutes). 2 each 1   lamoTRIgine (LAMICTAL XR) 50 MG 24 hour tablet Take 3 tablets (150 mg total) by mouth daily. 450 tablet 3   Vitamin D, Ergocalciferol, (DRISDOL) 1.25 MG (50000 UNIT) CAPS capsule TAKE 1 CAPSULE BY MOUTH EVERY 7 DAYS 5 capsule 0    EXAMINATION    Current vital signs:    01/30/2023    2:33 PM 01/30/2023    2:30 PM 01/30/2023    2:29 PM  Vitals with BMI  Systolic 109 120 098  Diastolic 67 96 80  Pulse 81 84 71    Examination:  GENERAL:  NAD HEENT: - Normocephalic and atraumatic, dry mm, no lymphadenopathy, no  Thyromegally LUNGS - Clear to auscultation bilaterally CV - S1S2 RRR, equal pulses bilaterally. ABDOMEN - Soft, nontender, nondistended with normoactive BS Ext: warm, well perfused, intact peripheral pulses, no pedal edema  NEURO:  Ment: Extremely limited, patient is sedated and intubated. No responses to any external stimuli.  Cranial Nerves:  II: PERRL III, IV, VI: No doll's eye reflex in the context of sedation V: No blink to eyelid stimulation  VII: No gross facial asymmetry   VIII:Unable to assess  IX, X: Intubated  XI: Unable to assess   XII: Unable to assess  Motor:  Flaccid tone x 4 in the context of sedation.  No muscle atrophy No jerking, twitching or other seizure-like activity seen.  Sensory: No movement to light pinch or light noxious plantar stimulation Reflexes: Hypoactive in the context of sedation.    LABS  I have reviewed labs in epic and the results pertinent to this consultation are:  Lab Results  Component Value Date   LDLCALC 110 (H) 11/06/2022   Lab Results  Component Value Date   ALT 31 01/30/2023   AST 52 (H) 01/30/2023   ALKPHOS 54 01/30/2023   BILITOT 0.9 01/30/2023   Lab Results  Component Value Date   HGBA1C 5.2 11/06/2022   Lab Results  Component Value Date   WBC 20.5 (H) 01/30/2023   HGB 13.9 01/30/2023   HCT 41.0 01/30/2023   MCV 91.6 01/30/2023   PLT 251 01/30/2023   No results found for: "VITAMINB12" No results found for: "FOLATE" Lab Results  Component Value Date   NA 137 01/30/2023   K 3.7 01/30/2023   CL 105 01/30/2023   CO2 15 (L) 01/30/2023   DIAGNOSTIC IMAGING/PROCEDURES   I have reviewed the images obtained:, as below    CT-head: Most recent 4/16 which shows no acute intracranial process  Most recent MRI brain on file from 2019 showed slight hyperintensity of left hippocampus as well as left temporal horn atrophic changes compared to right. 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   MRI brain images from 2009 have been personally reviewed. There was question of slight hyperintensity of the hippocampus on the left compared to the right.  The temporal horn of the left lateral ventricle was actually  slightly smaller on the left than the right at that time.   EEG pending full read, but with burst suppression noted thus far. No electrographic seizures seen on bedside EEG review. Beta activity is seen within the bursts, most consistent with sedative effect.   ASSESSMENT/PLAN    Assessment: 32 year old gentleman presenting with status epilepticus after breakthrough seizures at home, despite compliance with his home Lamictal  regimen.   - In the absence of recent head trauma, CNS infection, drug use, insomnia, or systemic infection, etiology for the breakthrough seizures is not entirely clear, but family states that he has had stress-triggered seizures in the past and he has been under significant multiple stressors recently.   - His lamotrigine level a few months ago was just above normal limits, raising suspicion for physiologically subtherapeutic levels as the culprit. Lamotrigine level drawn this admission is pending.  - Patient is now intubated and sedated on fentanyl, propofol and Versed. He is status post 10 mg Ativan, and a 4120 mg IV Keppra load  Impression: Status epilepticus following breakthrough seizures at home.   Recommendations: - Continue long-term EEG monitoring.  - Maintenance medication:  - Recommend placing NG tube and starting Lamictal immediate release 100 mg BID (was on 150 mg extended release at home) -Start Keppra 500 mg BID  - Wean sedation in the morning while on LTM EEG - Maintain seizure precautions - Will need a repeat examination once off sedation.   - Follow-up lamotrigine level.  If within therapeutic range, would likely want to continue with Keppra.  Will touch base with his outpatient epileptologist  at some point as well.  Most recent level 3 months ago was 2.2, which is just within the therapeutic range - Discussed the above with family at the bedside. All questions answered.   Sanjuana Letters, PA-C Neurology Pager # [phone]   40 minutes spent in the emergent neurological evaluation and management of this critically ill patient.   I have seen and examined the patient. I have formulated the assessment and recommendations. 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 Exam is not informative due to propofol and Versed sedation. EEG with burst suppression pattern; no electrographic seizures seen on bedside review. Recommendations as above.  Electronically signed: Dr. Caryl Pina

## 2023-01-30 NOTE — H&P (Signed)
NAME:  Joseph Cameron, MRN:  [id number], DOB:  03/08/1991, LOS: 0 ADMISSION DATE:  01/30/2023, CONSULTATION DATE: 01/30/2023 REFERRING MD: ED doc Dr. Jearld Fenton, CHIEF COMPLAINT: Status epilepticus  History of Present Illness:  Patient with known history of seizure disorder, compliant with medication Noted to have a seizure at about 1 AM 01/30/2023-lasted less than 1 minute Came to the emergency department was treated observed and discharged -he was back to his baseline when he was discharged  Another seizure was noted about 7:30 in the morning and then another 1 about 8:30 in the morning this was when EMS was activated  While in the emergency department had a seizure lasting about 8 to 9 minutes -Received 4 mg of Ativan Intubated and now on ventilator -On propofol, loaded with Keppra, on fentanyl  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 Olegario Messier at [facility] neurology  Pertinent  Medical History   Past Medical History:  Diagnosis Date   Acute tonsillitis 04/07/2013   Seizures    SYNCOPE, VASOVAGAL 07/05/2009   Qualifier: Diagnosis of   By: Amador Cunas  MD, Janett Labella       Significant Hospital Events: Including procedures, antibiotic start and stop dates in addition to other pertinent events   Admit to ICU 4/16  Interim History / Subjective:  Sedated on vent  Objective   Blood pressure 110/80, pulse 99, temperature 98.4 F (36.9 C), temperature source Oral, resp. rate 18, weight 103 kg, SpO2 98 %.    Vent Mode: PRVC FiO2 (%):  [70 %-100 %] 70 % Set Rate:  [18 bmp] 18 bmp Vt Set:  [295 mL] 680 mL PEEP:  [5 cmH20] 5 cmH20 Plateau Pressure:  [13 cmH20] 13 cmH20  No intake or output data in the 24 hours ending 01/30/23 1218 Filed Weights   01/30/23 0944  Weight: 103 kg    Examination: General:  Young gentleman, does not appear to be in distress HENT: Moist oral mucosa, endotracheal tube in place, some tongue erythema/small injuries Lungs: Clear breath sounds Cardiovascular: S1-S2 appreciated Abdomen: Soft, bowel sounds appreciated Extremities: No clubbing, no edema Neuro: Sedated, intermittently moving upper extremities GU:   Bicarb of 15, was 26 this morning CK elevated at 1465-likely related to persistent seizures  Resolved Hospital Problem list     Assessment & Plan:  Status epilepticus -Received Ativan -Loaded with Keppra -On propofol -Fentanyl  Hypoxemic respiratory failure -Continue mechanical ventilation  -Target TVol 6-8cc/kgIBW -Target Plateau Pressure < 30cm H20 -Target driving pressure less than 15 cm of water -Target PaO2 55-65: titrate PEEP/FiO2 per protocol -Ventilator associated pneumonia prevention protocol  Elevated CK -Due to repeated seizures -Monitor  Leukocytosis likely stress response -Patient was not having any symptoms of infection prior to having seizures  LR at 100 cc an hour  Will be admitted to neuro ICU  Continuous EEG monitoring  Neuro already consulted  Best Practice (right click and "Reselect all SmartList Selections" daily)   Diet/type: NPO DVT prophylaxis: LMWH GI prophylaxis: N/A Lines: N/A Foley:  N/A Code Status:  full code Last date of multidisciplinary goals of care discussion [discussed with significant other and mother at bedside]  Labs   CBC: Recent Labs  Lab 01/30/23 0310 01/30/23 0959  WBC 10.1 20.5*  NEUTROABS 7.1 15.4*  HGB 15.2 16.8  HCT 44.7 53.1*  MCV 85.5 91.6  PLT 212 251    Basic Metabolic Panel: Recent Labs  Lab 01/30/23 0310 01/30/23 1047  NA 138 137  K 3.5 3.8  CL 101 105  CO2 26 15*  GLUCOSE 118* 122*  BUN 13 13  CREATININE 1.09 1.21  CALCIUM 9.1 9.1  MG  --  2.5*   GFR: Estimated Creatinine Clearance: 108.6 mL/min (by C-G formula based on SCr of 1.21 mg/dL). Recent  Labs  Lab 01/30/23 0310 01/30/23 0959  WBC 10.1 20.5*    Liver Function Tests: Recent Labs  Lab 01/30/23 1047  AST 52*  ALT 31  ALKPHOS 54  BILITOT 0.9  PROT 8.7*  ALBUMIN 5.0   No results for input(s): "LIPASE", "AMYLASE" in the last 168 hours. No results for input(s): "AMMONIA" in the last 168 hours.  ABG    Component Value Date/Time   HCO3 18.8 (L) 01/30/2023 1202   ACIDBASEDEF 5.9 (H) 01/30/2023 1202   O2SAT 81.5 01/30/2023 1202     Coagulation Profile: No results for input(s): "INR", "PROTIME" in the last 168 hours.  Cardiac Enzymes: Recent Labs  Lab 01/30/23 1047  CKTOTAL 1,465*    HbA1C: Hgb A1c MFr Bld  Date/Time Value Ref Range Status  11/06/2022 08:28 AM 5.2 4.6 - 6.5 % Final    Comment:    Glycemic Control Guidelines for People with Diabetes:Non Diabetic:  <6%Goal of Therapy: <7%Additional Action Suggested:  >8%     CBG: Recent Labs  Lab 01/30/23 0225 01/30/23 1104  GLUCAP 94 129*    Review of Systems:   Sedated, unresponsive  Past Medical History:  He,  has a past medical history of Acute tonsillitis (04/07/2013), Seizures, and SYNCOPE, VASOVAGAL (07/05/2009).   Surgical History:   Past Surgical History:  Procedure Laterality Date   TONSILLECTOMY       Social History:   reports that he has been smoking. He uses smokeless tobacco. He reports current alcohol use of about 1.0 standard drink of alcohol per week. He reports that he does not use drugs.   Family History:  His family history includes Diabetes in his maternal grandfather, maternal grandmother, and mother; Hyperlipidemia in his mother; Hypertension in his father, paternal grandfather, and paternal grandmother; Leukemia in his maternal grandmother; Seizures in his maternal aunt.   Allergies No Known Allergies   The patient is critically ill with multiple organ systems failure and requires high complexity decision making for assessment and support, frequent evaluation and  titration of therapies, application of advanced monitoring technologies and extensive interpretation of multiple databases. Critical Care Time devoted to patient care services described in this note independent of APP/resident time (if applicable)  is 33 minutes.   Virl Diamond MD [facility] Personal pager: See Amion If unanswered, please page CCM On-call: #[phone]

## 2023-01-30 NOTE — Telephone Encounter (Signed)
Pt's sign other, Raynelle Fanning called In stating pt has had 3 seizures. He had one last night and went to [employer]. He was there three hours and was sent home. He has had two more seizures since being back home. I called nurse triage and no one could speak with him at that time. They were going to call him back.

## 2023-01-30 NOTE — Progress Notes (Signed)
Assisted with transferring patient to [facility] CT while on ventilator (100% Fi02)- uneventful.

## 2023-01-30 NOTE — ED Triage Notes (Signed)
C/o seizure x2 after being discharged this am. Family reports witness and occurred while sleeping x "few mins". Seizure at 0720 and 0830.  Incontinent urine noted per family Denies vomiting but family did turn patient on side.  Family reports patient hasn't returned to baseline yet

## 2023-01-30 NOTE — ED Triage Notes (Signed)
Pt states that he had a seizure today, witnessed by family, hx of the same, on meds and reports compliance with meds. Now AxO x 4

## 2023-01-30 NOTE — Progress Notes (Signed)
Called report to [facility] Medical ICU RT (PT will not be going to Neuro ICU at this time).

## 2023-01-30 NOTE — Progress Notes (Signed)
Assisted with transporting patient from [facility] CT to [facility] while on ventilator (100% Fi02)- uneventful.

## 2023-01-30 NOTE — ED Notes (Signed)
Patient transported to CT 

## 2023-01-30 NOTE — Telephone Encounter (Signed)
Fyi. While trying to contact patient I noticed in chart that patient has been admitted to ED.

## 2023-01-31 DIAGNOSIS — G40901 Epilepsy, unspecified, not intractable, with status epilepticus: Secondary | ICD-10-CM | POA: Diagnosis not present

## 2023-01-31 LAB — GLUCOSE, CAPILLARY
Glucose-Capillary: 119 mg/dL — ABNORMAL HIGH (ref 70–99)
Glucose-Capillary: 127 mg/dL — ABNORMAL HIGH (ref 70–99)
Glucose-Capillary: 56 mg/dL — ABNORMAL LOW (ref 70–99)
Glucose-Capillary: 65 mg/dL — ABNORMAL LOW (ref 70–99)
Glucose-Capillary: 74 mg/dL (ref 70–99)
Glucose-Capillary: 74 mg/dL (ref 70–99)
Glucose-Capillary: 78 mg/dL (ref 70–99)
Glucose-Capillary: 85 mg/dL (ref 70–99)

## 2023-01-31 LAB — BASIC METABOLIC PANEL
Anion gap: 12 (ref 5–15)
Anion gap: 12 (ref 5–15)
BUN: 14 mg/dL (ref 6–20)
BUN: 19 mg/dL (ref 6–20)
CO2: 21 mmol/L — ABNORMAL LOW (ref 22–32)
CO2: 16 mmol/L — ABNORMAL LOW (ref 22–32)
Calcium: 8.8 mg/dL — ABNORMAL LOW (ref 8.9–10.3)
Calcium: 8.5 mg/dL — ABNORMAL LOW (ref 8.9–10.3)
Chloride: 106 mmol/L (ref 98–111)
Chloride: 108 mmol/L (ref 98–111)
Creatinine, Ser: 2.21 mg/dL — ABNORMAL HIGH (ref 0.61–1.24)
Creatinine, Ser: 2.56 mg/dL — ABNORMAL HIGH (ref 0.61–1.24)
GFR, Estimated: 40 mL/min — ABNORMAL LOW (ref >60)
GFR, Estimated: 34 mL/min — ABNORMAL LOW (ref >60)
Glucose, Bld: 87 mg/dL (ref 70–99)
Glucose, Bld: 131 mg/dL — ABNORMAL HIGH (ref 70–99)
Potassium: 3.1 mmol/L — ABNORMAL LOW (ref 3.5–5.1)
Potassium: 3.5 mmol/L (ref 3.5–5.1)
Sodium: 139 mmol/L (ref 135–145)
Sodium: 136 mmol/L (ref 135–145)

## 2023-01-31 LAB — TRIGLYCERIDES: Triglycerides: 269 mg/dL — ABNORMAL HIGH (ref <150)

## 2023-01-31 LAB — PHOSPHORUS
Phosphorus: 1.7 mg/dL — ABNORMAL LOW (ref 2.5–4.6)
Phosphorus: 3.4 mg/dL (ref 2.5–4.6)

## 2023-01-31 LAB — MAGNESIUM
Magnesium: 2.3 mg/dL (ref 1.7–2.4)
Magnesium: 1.9 mg/dL (ref 1.7–2.4)

## 2023-01-31 LAB — CK: Total CK: 1971 U/L — ABNORMAL HIGH (ref 49–397)

## 2023-01-31 LAB — LAMOTRIGINE LEVEL: Lamotrigine Lvl: 2 ug/mL (ref 2.0–20.0)

## 2023-01-31 MED ORDER — ACETAMINOPHEN 325 MG PO TABS
650.0000 mg | ORAL_TABLET | Freq: Four times a day (QID) | ORAL | Status: DC | PRN
  Administered 2023-01-31 – 2023-02-01 (×3): 650 mg
  Filled 2023-01-31 (×3): qty 2

## 2023-01-31 MED ORDER — DEXTROSE 50 % IV SOLN
INTRAVENOUS | Status: AC
  Filled 2023-01-31: qty 50

## 2023-01-31 MED ORDER — DEXTROSE 50 % IV SOLN
25.0000 mL | Freq: Once | INTRAVENOUS | Status: AC
  Administered 2023-01-31: 25 mL via INTRAVENOUS

## 2023-01-31 MED ORDER — DOCUSATE SODIUM 50 MG/5ML PO LIQD
100.0000 mg | Freq: Two times a day (BID) | ORAL | Status: DC
  Administered 2023-01-31 – 2023-02-01 (×4): 100 mg
  Filled 2023-01-31 (×4): qty 10

## 2023-01-31 MED ORDER — OSMOLITE 1.5 CAL PO LIQD
1000.0000 mL | ORAL | Status: DC
  Administered 2023-01-31 – 2023-02-01 (×2): 1000 mL
  Filled 2023-01-31 (×5): qty 1000

## 2023-01-31 MED ORDER — DEXTROSE 50 % IV SOLN
25.0000 g | INTRAVENOUS | Status: AC
  Administered 2023-01-31: 25 g via INTRAVENOUS

## 2023-01-31 MED ORDER — POTASSIUM CHLORIDE 20 MEQ PO PACK
40.0000 meq | PACK | Freq: Once | ORAL | Status: AC
  Administered 2023-01-31: 40 meq
  Filled 2023-01-31: qty 2

## 2023-01-31 MED ORDER — PROSOURCE TF20 ENFIT COMPATIBL EN LIQD
60.0000 mL | Freq: Every day | ENTERAL | Status: DC
  Administered 2023-01-31 – 2023-02-01 (×2): 60 mL
  Filled 2023-01-31 (×2): qty 60

## 2023-01-31 MED ORDER — POLYETHYLENE GLYCOL 3350 17 G PO PACK: 17.0000 g | PACK | Freq: Every day | ORAL | Status: DC | PRN

## 2023-01-31 MED ORDER — DEXTROSE 10 % IV SOLN: INTRAVENOUS | Status: DC

## 2023-01-31 MED ORDER — POTASSIUM CHLORIDE 10 MEQ/100ML IV SOLN
10.0000 meq | INTRAVENOUS | Status: AC
  Administered 2023-01-31 (×4): 10 meq via INTRAVENOUS
  Filled 2023-01-31 (×4): qty 100

## 2023-01-31 MED ORDER — POLYETHYLENE GLYCOL 3350 17 G PO PACK
17.0000 g | PACK | Freq: Every day | ORAL | Status: DC
  Administered 2023-01-31 – 2023-02-01 (×2): 17 g
  Filled 2023-01-31 (×2): qty 1

## 2023-01-31 MED ORDER — SODIUM PHOSPHATES 45 MMOLE/15ML IV SOLN
30.0000 mmol | Freq: Once | INTRAVENOUS | Status: AC
  Administered 2023-01-31: 30 mmol via INTRAVENOUS
  Filled 2023-01-31: qty 10

## 2023-01-31 MED ORDER — LACTATED RINGERS IV SOLN: INTRAVENOUS | Status: AC

## 2023-01-31 MED ORDER — SODIUM CHLORIDE 0.9 % IV SOLN: INTRAVENOUS | Status: DC | PRN

## 2023-01-31 MED ORDER — FENTANYL BOLUS VIA INFUSION
25.0000 ug | INTRAVENOUS | Status: DC | PRN
  Administered 2023-02-01: 100 ug via INTRAVENOUS
  Administered 2023-02-01: 50 ug via INTRAVENOUS
  Administered 2023-02-01: 100 ug via INTRAVENOUS
  Administered 2023-02-01: 50 ug via INTRAVENOUS
  Administered 2023-02-01 (×2): 100 ug via INTRAVENOUS

## 2023-01-31 NOTE — Progress Notes (Signed)
Initial Nutrition Assessment  DOCUMENTATION CODES:   Not applicable  INTERVENTION:   Initiate tube feeds via OG tube: - Start Osmolite 1.5 @ 40 ml/hr and advance rate by 10 ml q 4 hours to goal rate of 70 ml/hr (1680 ml/day) - PROSource TF20 60 ml daily  Tube feeding regimen at goal rate provides 2600 kcal, 125 grams of protein, and 1280 ml of H2O.  NUTRITION DIAGNOSIS:   Inadequate oral intake related to inability to eat as evidenced by NPO status.  GOAL:   Patient will meet greater than or equal to 90% of their needs  MONITOR:   Vent status, Labs, Weight trends, TF tolerance, I & O's  REASON FOR ASSESSMENT:   Ventilator, Consult Enteral/tube feeding initiation and management, Assessment of nutrition requirement/status  ASSESSMENT:   32 year old male who presented to the ED on 4/16 with seizures. PMH of ADHD, seizure disorder. Pt admitted with status epilepticus and required intubation.  Consult received for enteral nutrition initiation and management. Pt with OG tube in stomach per x-ray. Weaning propofol, versed, and fentanyl as able today.  Spoke with pt's wife and mom at bedside. Pt with an excellent appetite PTA and no recent weight changes. Pt eats multiple meals daily and enjoys his mother's cooking including specific Arabic dishes. No nutrition-related concerns PTA.  Patient is currently intubated on ventilator support Temp (24hrs), Avg:98.8 F (37.1 C), Min:97.7 F (36.5 C), Max:99.8 F (37.7 C)  Drips: Propofol: 70 mcg/kg/min (provides 1143 kcal daily from lipid) Versed LR: 125 ml/hr  Medications reviewed and include: colace, pepcid, SSI q 4 hours, miralax, IV KCl 10 mEq x 4, IV sodium phosphate 30 mmol x 1  Labs reviewed: potassium 3.1, phosphorus 1.7, creatinine 2.21, magnesium 2.5, WBC 14.9 CBG's: 53-129 x 24 hours  UOP: 1075 ml x 24 hours OGT: 90 ml x 24 hours I/O's: +1.3 L since admit  NUTRITION - FOCUSED PHYSICAL EXAM:  Flowsheet Row Most  Recent Value  Orbital Region No depletion  Upper Arm Region No depletion  Thoracic and Lumbar Region No depletion  Buccal Region Unable to assess  Temple Region No depletion  Clavicle Bone Region No depletion  Clavicle and Acromion Bone Region No depletion  Scapular Bone Region Unable to assess  Dorsal Hand No depletion  Patellar Region No depletion  Anterior Thigh Region No depletion  Posterior Calf Region No depletion  Edema (RD Assessment) None  Hair Reviewed  Eyes Unable to assess  Mouth Reviewed  Skin Reviewed  Nails Reviewed       Diet Order:   Diet Order             Diet NPO time specified  Diet effective now                   EDUCATION NEEDS:   Education needs have been addressed  Skin:  Skin Assessment: Reviewed RN Assessment  Last BM:  01/30/23  Height:   Ht Readings from Last 1 Encounters:  11/03/22 6' 3.67" (1.922 m)    Weight:   Wt Readings from Last 1 Encounters:  01/30/23 103 kg    BMI:  Body mass index is 27.88 kg/m.  Estimated Nutritional Needs:   Kcal:  2400-2600  Protein:  120-140 grams  Fluid:  >2.2 L    Mertie Clause, MS, RD, LDN Inpatient Clinical Dietitian Please see AMiON for contact information.

## 2023-01-31 NOTE — Progress Notes (Signed)
eLink Physician-Brief Progress Note Patient Name: Joseph Cameron DOB: August 16, 1991 MRN: [id number]   Date of Service  01/31/2023  HPI/Events of Note  Blood sugar 53 despite D 5 LR gtt running at 100 ml / hour.  eICU Interventions  D 10 % gtt started at 50 ml / hour.        [name] 01/31/2023, 12:14 AM

## 2023-01-31 NOTE — Progress Notes (Signed)
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 He has been adherent to his home lamictal and followed by Dr. Olegario Messier at [facility] neurology. Noted to have a seizure at about 1 AM 01/30/2023-lasted less than 1 minute came to the emergency department was treated observed and discharged. He was back to his baseline when he was discharged   Another seizure was noted about 7:30am and then another 1 about 8:30am and EMS was called.   While in the emergency department had a seizure at 10 am lasting about 8 to 9 minutes, received 4 mg of Ativan. Was then intubated and placed on a ventilator. Started on propofol, fentanyl and loaded with keppra. Family denied recent illness or new medications. Patient has had breakthrough seizures in the past with increased stressors, family note has had increase stress over last few days.   Pertinent  Medical History  Epilepsy   Significant Hospital Events: Including procedures, antibiotic start and stop dates in addition to other pertinent events   Admitted 4/17 for breakthrough seizures, intubated for airway protection  Interim History / Subjective:  Hypoglycemic overnight, D10 gtt started. Remains sedated. Cousin and fiance at bedside and updated.   Objective   Blood pressure 101/65, pulse 80, temperature 98.4 F (36.9 C), temperature source Axillary, resp. rate 18, weight 103 kg, SpO2 100 %.    Vent Mode: PRVC FiO2 (%):  [40 %-100 %] 40 % Set Rate:  [18 bmp] 18 bmp Vt Set:  [680 mL] 680 mL PEEP:  [5 cmH20] 5 cmH20 Plateau Pressure:  [0 cmH20-16 cmH20] 16 cmH20   Intake/Output Summary (Last 24 hours) at 01/31/2023 0737 Last data filed at 01/31/2023 0600 Gross per 24 hour  Intake  2310.59 ml  Output 1165 ml  Net 1145.59 ml   Filed Weights   01/30/23 0944  Weight: 103 kg   Examination: General: Ill appaering HENT: ET tube in place Lungs: Clear to auscultation Cardiovascular: regular rate and rhythm, no murmurs, gallops, or rubs Abdomen: soft, non-distended Extremities: warm, no edema Neuro: sedated  Resolved Hospital Problem list   N/A  Assessment & Plan:   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 No new medications or illness. Unclear etiology, does have increase stressors recently. Will wean sedatives and can then fully assess neurologically. Continue EEG -appreciate neurology assistance -continue keppra BID and lamictal -wean propofol, versed, and fentanyl as able -f/u lamictal level -if continues to seize and/or be post-ictal can repeat MRI, otherwise would delay until follows up with primary neurologist  Acute hypoxic respiratory failure Secondary to sedating medications. Work to wean medications and extubate as able based on if continues to seize and comfort level -wean sedatives -VAP protocol and prevention -extubate when able  Acute encephalopathy 2/2 seizures and sedating medications -monitor and wean medication as able   Acute kidney injury Likely in setting of seizures and concern for rhabdomyolysis. Will recheck CK, UA, and start IV fluids. Possibly pre-renal from dehydration.  - LR infusion 125 cc for 8 hours - follow up CK and UA - if no improvement in renal function can check renal US  Anion gap metabolic acidosis Likely in setting of seizures and dehydration -trend  tomorrow  Electrolyte abnormalities Potassium of 3.1. Will check magnesium and replete. Starting enteric feeding today -replete potassium -mag and phos pending  Leukocytosis Likely stress response in setting of seizure. Down trending and will follow up tomorrow -repeat CBC tomorrow  Best Practice (right  click and "Reselect all SmartList Selections" daily)   Diet/type: NG tube in place, start tube enteral feeding DVT prophylaxis: LMWH GI prophylaxis: H2B Lines: N/A Foley:  external catheter Code Status:  full code Last date of multidisciplinary goals of care discussion - fiance and cousin bedside  Labs   CBC: Recent Labs  Lab 01/30/23 0310 01/30/23 0959 01/30/23 1502 01/30/23 1633  WBC 10.1 20.5*  --  14.9*  NEUTROABS 7.1 15.4*  --   --   HGB 15.2 16.8 13.9 14.9  HCT 44.7 53.1* 41.0 43.7  MCV 85.5 91.6  --  85.4  PLT 212 251  --  228    Basic Metabolic Panel: Recent Labs  Lab 01/30/23 0310 01/30/23 1047 01/30/23 1502 01/30/23 1633  NA 138 137 137  --   K 3.5 3.8 3.7  --   CL 101 105  --   --   CO2 26 15*  --   --   GLUCOSE 118* 122*  --   --   BUN 13 13  --   --   CREATININE 1.09 1.21  --  1.37*  CALCIUM 9.1 9.1  --   --   MG  --  2.5*  --   --    GFR: Estimated Creatinine Clearance: 95.9 mL/min (A) (by C-G formula based on SCr of 1.37 mg/dL (H)). Recent Labs  Lab 01/30/23 0310 01/30/23 0959 01/30/23 1202 01/30/23 1633  WBC 10.1 20.5*  --  14.9*  LATICACIDVEN  --   --  3.8*  --     Liver Function Tests: Recent Labs  Lab 01/30/23 1047  AST 52*  ALT 31  ALKPHOS 54  BILITOT 0.9  PROT 8.7*  ALBUMIN 5.0   No results for input(s): "LIPASE", "AMYLASE" in the last 168 hours. No results for input(s): "AMMONIA" in the last 168 hours.  ABG    Component Value Date/Time   PHART 7.389 01/30/2023 1502   PCO2ART 30.9 (L) 01/30/2023 1502   PO2ART 388 (H) 01/30/2023 1502   HCO3 18.6 (L) 01/30/2023 1502   TCO2 20 (L) 01/30/2023 1502   ACIDBASEDEF 5.0 (H) 01/30/2023 1502   O2SAT 100 01/30/2023 1502     Coagulation Profile: No results for input(s): "INR", "PROTIME" in the last 168 hours.  Cardiac Enzymes: Recent Labs  Lab 01/30/23 1047  CKTOTAL 1,465*    HbA1C: Hgb A1c MFr Bld  Date/Time Value Ref Range Status  11/06/2022 08:28 AM 5.2 4.6 - 6.5  % Final    Comment:    Glycemic Control Guidelines for People with Diabetes:Non Diabetic:  <6%Goal of Therapy: <7%Additional Action Suggested:  >8%     CBG: Recent Labs  Lab 01/30/23 1956 01/30/23 2028 01/30/23 2352 01/31/23 0030 01/31/23 0346  GLUCAP 61* 103* 53* 85 74    Review of Systems:   As per history and HPI  Past Medical History:  He,  has a past medical history of Acute tonsillitis (04/07/2013), Seizures, and SYNCOPE, VASOVAGAL (07/05/2009).   Surgical History:   Past Surgical History:  Procedure Laterality Date   TONSILLECTOMY       Social History:   reports that he has been smoking. He uses smokeless tobacco. He reports current alcohol use of  about 1.0 standard drink of alcohol per week. He reports that he does not use drugs.   Family History:  His family history includes Diabetes in his maternal grandfather, maternal grandmother, and mother; Hyperlipidemia in his mother; Hypertension in his father, paternal grandfather, and paternal grandmother; Leukemia in his maternal grandmother; Seizures in his maternal aunt.   Allergies No Known Allergies   Home Medications  Prior to Admission medications   Medication Sig Start Date End Date Taking? Authorizing Provider  acetaminophen (TYLENOL) 325 MG tablet Take 650 mg by mouth every 6 (six) hours as needed for moderate pain.   Yes [provider]  diazePAM, 15 MG Dose, (VALTOCO 15 MG DOSE) 2 x 7.5 MG/0.1ML LQPK Place 7.5 mg into the nose as needed (For seizure lasting more than 2 minutes). 08/03/22  Yes Windell Norfolk, MD  lamoTRIgine (LAMICTAL XR) 50 MG 24 hour tablet Take 3 tablets (150 mg total) by mouth daily. 10/26/22 10/21/23 Yes Windell Norfolk, MD  Vitamin D, Ergocalciferol, (DRISDOL) 1.25 MG (50000 UNIT) CAPS capsule TAKE 1 CAPSULE BY MOUTH EVERY 7 DAYS 12/18/22  Yes Garnette Gunner, MD     Thalia Bloodgood Internal Medicine Resident PGY-3 [facility]

## 2023-01-31 NOTE — Progress Notes (Signed)
Subjective: No further seizures overnight.  Fiance and mother at bedside.   ROS: Unable to obtain due to intubation  Examination  Vital signs in last 24 hours: Temp:  [97.7 F (36.5 C)-99.8 F (37.7 C)] 99.8 F (37.7 C) (04/17 1118) Pulse Rate:  [65-90] 90 (04/17 1124) Resp:  [17-23] 18 (04/17 1124) BP: (91-120)/(58-96) 95/66 (04/17 0830) SpO2:  [99 %-100 %] 100 % (04/17 1124) FiO2 (%):  [30 %-70 %] 30 % (04/17 1124)  General: lying in bed, intubated Neuro: On propofol at 50 mcg/kg/min, comatose, does not open eyes to noxious stimulation, does not follow commands, PERRLA, no forced gaze deviation, no withdrawal to noxious stimuli in all 4 extremities  Basic Metabolic Panel: Recent Labs  Lab 01/30/23 0310 01/30/23 1047 01/30/23 1502 01/30/23 1633 01/31/23 0724 01/31/23 0909  NA 138 137 137  --  139  --   K 3.5 3.8 3.7  --  3.1*  --   CL 101 105  --   --  106  --   CO2 26 15*  --   --  21*  --   GLUCOSE 118* 122*  --   --  87  --   BUN 13 13  --   --  14  --   CREATININE 1.09 1.21  --  1.37* 2.21*  --   CALCIUM 9.1 9.1  --   --  8.8*  --   MG  --  2.5*  --   --  2.3  --   PHOS  --   --   --   --   --  1.7*    CBC: Recent Labs  Lab 01/30/23 0310 01/30/23 0959 01/30/23 1502 01/30/23 1633  WBC 10.1 20.5*  --  14.9*  NEUTROABS 7.1 15.4*  --   --   HGB 15.2 16.8 13.9 14.9  HCT 44.7 53.1* 41.0 43.7  MCV 85.5 91.6  --  85.4  PLT 212 251  --  228    Coagulation Studies: No results for input(s): "LABPROT", "INR" in the last 72 hours.  Imaging CT head without contrast 01/30/2023: No acute abnormality.  ASSESSMENT AND PLAN: 32 year old male with history of epilepsy on lamotrigine presented with breakthrough seizure, no clear etiology.  Epilepsy breakthrough seizure -No clear etiology.  Lamotrigine level is pending.  Parents states patient does smoke marijuana at times due to stress.  Recommendations -Stop Versed -Start weaning propofol at 10 mcg/kg/min every  hour to stop. -Okay to use Precedex or fentanyl for agitation/sedation -Continue lamotrigine 100 mg twice daily (home dose) -Started on Keppra in the hospital, continue 500 mg twice daily for now.  Can likely stop and increase lamotrigine to 150 mg twice daily once extubated. -Continue LTM EEG while weaning sedation -Discussed plan in detail with fianc and mother at bedside as well as ICU team.  CRITICAL CARE Performed by: Charlsie Quest   Total critical care time: 40 minutes  Critical care time was exclusive of separately billable procedures and treating other patients.  Critical care was necessary to treat or prevent imminent or life-threatening deterioration.  Critical care was time spent personally by me on the following activities: development of treatment plan with patient and/or surrogate as well as nursing, discussions with consultants, evaluation of patient's response to treatment, examination of patient, obtaining history from patient or surrogate, ordering and performing treatments and interventions, ordering and review of laboratory studies, ordering and review of radiographic studies, pulse oximetry and re-evaluation of patient's condition.  Zeb Comfort Epilepsy [facility] For questions after 5pm please refer to AMION to reach the Neurologist on call

## 2023-01-31 NOTE — Telephone Encounter (Signed)
Admission note reviewed.

## 2023-01-31 NOTE — Procedures (Signed)
Patient Name: Joseph Cameron  MRN: [id number]  Epilepsy Attending: Charlsie Quest  Referring Physician/Provider: Sanjuana Letters, PA-C  Duration: 01/30/2023 1605 to 01/31/2023 1605   Patient history: 31yo M with status epilepticus, getting eeg to evaluate for seizure.   Level of alertness:  comatose   AEDs during EEG study: Propofol, Versed, LTG, LEV   Technical aspects: This EEG study was done with scalp electrodes positioned according to the 10-20 International system of electrode placement. Electrical activity was reviewed with band pass filter of 1-70Hz , sensitivity of 7 uV/mm, display speed of 65mm/sec with a  notched filter applied as appropriate. EEG data were recorded continuously and digitally stored.  Video monitoring was available and reviewed as appropriate.   Description: EEG showed burst suppression with burst of generalized sharply contoured 2-5Hz  theta-delta slowing admixed with 12-13Hz  beta activity lasting 2-3 seconds alternating with generalized suppression lasting 3-4 seconds.  As sedation was weaned, EEG appeared more continuous and showed continuous generalized 3 to 7 Hz theta-delta slowing.  Hyperventilation and photic stimulation were not performed.      ABNORMALITY -Burst suppression, generalized   IMPRESSION: This study is suggestive of profound diffuse encephalopathy likely related to sedation.  As sedation was weaned, EEG improved and was suggestive of moderate to severe diffuse encephalopathy.  No seizures or epileptiform discharges were seen throughout the recording.   Tevis Dunavan Annabelle Harman

## 2023-01-31 NOTE — Progress Notes (Signed)
Wasted 25ml of versed gtt with Karrie Doffing, RN

## 2023-02-01 ENCOUNTER — Inpatient Hospital Stay (HOSPITAL_COMMUNITY): Payer: 59

## 2023-02-01 DIAGNOSIS — G40901 Epilepsy, unspecified, not intractable, with status epilepticus: Secondary | ICD-10-CM | POA: Diagnosis not present

## 2023-02-01 LAB — GLUCOSE, CAPILLARY
Glucose-Capillary: 143 mg/dL — ABNORMAL HIGH (ref 70–99)
Glucose-Capillary: 117 mg/dL — ABNORMAL HIGH (ref 70–99)
Glucose-Capillary: 173 mg/dL — ABNORMAL HIGH (ref 70–99)
Glucose-Capillary: 113 mg/dL — ABNORMAL HIGH (ref 70–99)
Glucose-Capillary: 119 mg/dL — ABNORMAL HIGH (ref 70–99)
Glucose-Capillary: 120 mg/dL — ABNORMAL HIGH (ref 70–99)

## 2023-02-01 LAB — BASIC METABOLIC PANEL
Anion gap: 11 (ref 5–15)
BUN: 22 mg/dL — ABNORMAL HIGH (ref 6–20)
CO2: 19 mmol/L — ABNORMAL LOW (ref 22–32)
Calcium: 8.2 mg/dL — ABNORMAL LOW (ref 8.9–10.3)
Chloride: 110 mmol/L (ref 98–111)
Creatinine, Ser: 2.51 mg/dL — ABNORMAL HIGH (ref 0.61–1.24)
GFR, Estimated: 34 mL/min — ABNORMAL LOW (ref >60)
Glucose, Bld: 107 mg/dL — ABNORMAL HIGH (ref 70–99)
Potassium: 3.9 mmol/L (ref 3.5–5.1)
Sodium: 140 mmol/L (ref 135–145)

## 2023-02-01 LAB — CBC
HCT: 39.5 % (ref 39.0–52.0)
Hemoglobin: 13.8 g/dL (ref 13.0–17.0)
MCH: 29.4 pg (ref 26.0–34.0)
MCHC: 34.9 g/dL (ref 30.0–36.0)
MCV: 84 fL (ref 80.0–100.0)
Platelets: 188 10*3/uL (ref 150–400)
RBC: 4.7 MIL/uL (ref 4.22–5.81)
RDW: 12.4 % (ref 11.5–15.5)
WBC: 8.5 10*3/uL (ref 4.0–10.5)
nRBC: 0 % (ref 0.0–0.2)

## 2023-02-01 LAB — MAGNESIUM
Magnesium: 2 mg/dL (ref 1.7–2.4)
Magnesium: 1.7 mg/dL (ref 1.7–2.4)

## 2023-02-01 LAB — PHOSPHORUS
Phosphorus: 4.1 mg/dL (ref 2.5–4.6)
Phosphorus: 4.9 mg/dL — ABNORMAL HIGH (ref 2.5–4.6)

## 2023-02-01 MED ORDER — OXYCODONE HCL 5 MG PO TABS
5.0000 mg | ORAL_TABLET | Freq: Four times a day (QID) | ORAL | Status: DC
  Administered 2023-02-01 – 2023-02-02 (×3): 5 mg
  Filled 2023-02-01 (×3): qty 1

## 2023-02-01 MED ORDER — MIDAZOLAM BOLUS VIA INFUSION: 2.0000 mg | INTRAVENOUS | Status: DC | PRN

## 2023-02-01 MED ORDER — DEXMEDETOMIDINE HCL IN NACL 400 MCG/100ML IV SOLN
0.0000 ug/kg/h | INTRAVENOUS | Status: DC
  Administered 2023-02-01: 0.6 ug/kg/h via INTRAVENOUS
  Administered 2023-02-01: 0.4 ug/kg/h via INTRAVENOUS
  Administered 2023-02-01: 0.62 ug/kg/h via INTRAVENOUS
  Administered 2023-02-02: 0.7 ug/kg/h via INTRAVENOUS
  Administered 2023-02-02: 0.6 ug/kg/h via INTRAVENOUS
  Filled 2023-02-01 (×5): qty 100

## 2023-02-01 MED ORDER — MIDAZOLAM HCL 2 MG/2ML IJ SOLN
INTRAMUSCULAR | Status: AC
  Filled 2023-02-01: qty 2

## 2023-02-01 MED ORDER — ACETAMINOPHEN 325 MG PO TABS
650.0000 mg | ORAL_TABLET | Freq: Once | ORAL | Status: AC
  Administered 2023-02-01: 650 mg
  Filled 2023-02-01: qty 2

## 2023-02-01 MED ORDER — SODIUM CHLORIDE 0.9 % IV SOLN
500.0000 mg | Freq: Every day | INTRAVENOUS | Status: DC
  Administered 2023-02-01: 500 mg via INTRAVENOUS
  Filled 2023-02-01 (×2): qty 5

## 2023-02-01 MED ORDER — ACETAMINOPHEN 160 MG/5ML PO SOLN: 650.0000 mg | Freq: Four times a day (QID) | ORAL | Status: DC | PRN

## 2023-02-01 MED ORDER — SODIUM CHLORIDE 0.9 % IV SOLN
2.0000 g | Freq: Every day | INTRAVENOUS | Status: DC
  Administered 2023-02-01: 2 g via INTRAVENOUS
  Filled 2023-02-01: qty 20

## 2023-02-01 NOTE — Progress Notes (Signed)
Patient transported from MRI to unit without complication. Vent connected back to the wall.

## 2023-02-01 NOTE — Progress Notes (Signed)
Subjective: No seizures overnight.  On pressor support this morning.  Required Precedex and propofol for agitation/sedation  ROS: Unable to obtain due to poor mental status  Examination  Vital signs in last 24 hours: Temp:  [99.9 F (37.7 C)-103.7 F (39.8 C)] 101.1 F (38.4 C) (04/18 1120) Pulse Rate:  [88-176] 98 (04/18 1110) Resp:  [16-37] 18 (04/18 1110) BP: (87-181)/(58-123) 100/60 (04/18 1100) SpO2:  [89 %-100 %] 99 % (04/18 1110) FiO2 (%):  [30 %-50 %] 40 % (04/18 1110) Weight:  [106.2 kg] 106.2 kg (04/18 0702)  General: lying in bed, intubated Neuro: On Precedex, comatose, does not open eyes to noxious stimulation, does not follow commands for me but did follow simple commands for RN earlier, PERRLA, no forced gaze deviation, spontaneously moving upper extremities with antigravity strength.  Basic Metabolic Panel: Recent Labs  Lab 01/30/23 0310 01/30/23 1047 01/30/23 1502 01/30/23 1633 01/31/23 0724 01/31/23 0909 01/31/23 1756 02/01/23 0643  NA 138 137 137  --  139  --  136 140  K 3.5 3.8 3.7  --  3.1*  --  3.5 3.9  CL 101 105  --   --  106  --  108 110  CO2 26 15*  --   --  21*  --  16* 19*  GLUCOSE 118* 122*  --   --  87  --  131* 107*  BUN 13 13  --   --  14  --  19 22*  CREATININE 1.09 1.21  --  1.37* 2.21*  --  2.56* 2.51*  CALCIUM 9.1 9.1  --   --  8.8*  --  8.5* 8.2*  MG  --  2.5*  --   --  2.3  --  1.9 2.0  PHOS  --   --   --   --   --  1.7* 3.4 4.1    CBC: Recent Labs  Lab 01/30/23 0310 01/30/23 0959 01/30/23 1502 01/30/23 1633 02/01/23 0643  WBC 10.1 20.5*  --  14.9* 8.5  NEUTROABS 7.1 15.4*  --   --   --   HGB 15.2 16.8 13.9 14.9 13.8  HCT 44.7 53.1* 41.0 43.7 39.5  MCV 85.5 91.6  --  85.4 84.0  PLT 212 251  --  228 188     Coagulation Studies: No results for input(s): "LABPROT", "INR" in the last 72 hours.  Imaging No new brain imaging overnight  ASSESSMENT AND PLAN: 32 year old male with history of epilepsy on lamotrigine  presented with breakthrough seizure, no clear etiology.   Epilepsy breakthrough seizure -No clear etiology.  Lamotrigine level is pending.  Family states patient does smoke marijuana at times due to stress.   Recommendations -Continue lamotrigine 100 mg twice daily (home dose is lamotrigine 150 mg extended strength daily) -Started on Keppra in the hospital, continue 500 mg twice daily for now.   -Will likely discontinue LTM EEG tomorrow once patient is more alert and following commands -Discussed plan with family at bedside  I have spent a total of  36  minutes with the patient reviewing hospital notes,  test results, labs and examining the patient as well as establishing an assessment and plan.  > 50% of time was spent in direct patient care.   Lindie Spruce Epilepsy [facility] For questions after 5pm please refer to AMION to reach the Neurologist on call

## 2023-02-01 NOTE — Progress Notes (Signed)
LTM EEG discontinued - no skin breakdown at unhook.   

## 2023-02-01 NOTE — Progress Notes (Signed)
eLink Physician-Brief Progress Note Patient Name: Joseph Cameron DOB: [birth year] MRN: [id number]   Date of Service  02/01/2023  HPI/Events of Note  Patient with urinary retention following Foley catheter removal.  He has been straight cathed twice and still has high residuals on bladder scan.  eICU Interventions  Will replace Foley catheter per ICU protocol.        Carilyn Goodpasture 02/01/2023, 6:32 AM

## 2023-02-01 NOTE — Progress Notes (Signed)
Patient transported to MRI without complication

## 2023-02-01 NOTE — Progress Notes (Signed)
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 He has been adherent to his home lamictal and followed by Dr. Olegario Messier at [facility] neurology. Noted to have a seizure at about 1 AM 01/30/2023-lasted less than 1 minute came to the emergency department was treated observed and discharged. He was back to his baseline when he was discharged   Another seizure was noted about 7:30am and then another 1 about 8:30am and EMS was called.   While in the emergency department had a seizure at 10 am lasting about 8 to 9 minutes, received 4 mg of Ativan. Was then intubated and placed on a ventilator. Started on propofol, fentanyl and loaded with keppra. Family denied recent illness or new medications. Patient has had breakthrough seizures in the past with increased stressors, family note has had increase stress over last few days.   Pertinent  Medical History  Epilepsy   Significant Hospital Events: Including procedures, antibiotic start and stop dates in addition to other pertinent events   Admitted 4/17 for breakthrough seizures, intubated for airway protection  Interim History / Subjective:  Overnight sedatives weaned and patient pulling at tubes/lines, restarted on propofol and fentanyl. Continued to have artifact on EEG.   Objective   Blood pressure 100/60, pulse 98, temperature (!) 101.1 F (38.4 C), temperature source Other (Comment), resp. rate 18, weight 106.2 kg, SpO2 99 %.    Vent Mode: CPAP;PSV FiO2 (%):  [30 %-50 %] 40 % Set Rate:  [18 bmp] 18 bmp Vt Set:  [680 mL] 680 mL PEEP:  [5 cmH20] 5 cmH20 Pressure Support:  [5 cmH20] 5 cmH20 Plateau Pressure:  [16 cmH20-19 cmH20] 19 cmH20   Intake/Output Summary (Last 24  hours) at 02/01/2023 1201 Last data filed at 02/01/2023 1018 Gross per 24 hour  Intake 3589.87 ml  Output 1690 ml  Net 1899.87 ml    Filed Weights   01/30/23 0944 02/01/23 0702  Weight: 103 kg 106.2 kg   Examination: General: Ill appaering HENT: ET tube in place with white purulent material in suction Lungs: Clear to auscultation Cardiovascular: regular rate and rhythm, no murmurs, gallops, or rubs Abdomen: soft, non-distended Extremities: warm, no edema Neuro: sedated  Resolved Hospital Problem list   Seizure like activity  Assessment & Plan:   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 Will wean sedatives today. With concern for pneumonia as per below, unclear if this lowered seizure threshold and led to event.  -appreciate neurology assistance -continue keppra BID and lamictal -wean propofol and fentanyl as able -lamotrigine level of 2.0 on admission  Left lower opacity pneumonia Unclear if occurred prior to seizures and lowered his seizure threshold or secondary to aspiration event from seizures. Will treat as CAP with azithromycin and ceftriaxone. Febrile but WBC improving.  -azithromycin and ceftriaxone day 1/5 -continue VAP and plan for extubation today if able to wean -follow up blood and sputum cultures  Acute hypoxic respiratory failure Secondary to sedating medications. Don't suspect secondary to pneumonia, will see how he does with SBT today.  -wean sedatives -VAP protocol and prevention -extubate when able  Acute encephalopathy 2/2 seizures and sedating medications -monitor and wean medication  as able   Acute kidney injury Suspect pre-renal etiology. Cr peaked at 2.56, 2.51 today. Receivin fluids with tube feeds and medications. Follow up UA. CK minimally elevated.  - follow up UA  Anion gap metabolic acidosis Likely in setting of seizures and dehydration. Improving -trend  daily  Electrolyte abnormalities Electrolytes stable  Best Practice (right click and "Reselect all SmartList Selections" daily)   Diet/type: NG tube in place hold feeds DVT prophylaxis: LMWH GI prophylaxis: H2B Lines: N/A Foley:  foley catheter placed secondary to retention Code Status:  full code Last date of multidisciplinary goals of care discussion - family updated at bedside  Labs   CBC: Recent Labs  Lab 01/30/23 0310 01/30/23 0959 01/30/23 1502 01/30/23 1633 02/01/23 0643  WBC 10.1 20.5*  --  14.9* 8.5  NEUTROABS 7.1 15.4*  --   --   --   HGB 15.2 16.8 13.9 14.9 13.8  HCT 44.7 53.1* 41.0 43.7 39.5  MCV 85.5 91.6  --  85.4 84.0  PLT 212 251  --  228 188     Basic Metabolic Panel: Recent Labs  Lab 01/30/23 0310 01/30/23 1047 01/30/23 1502 01/30/23 1633 01/31/23 0724 01/31/23 0909 01/31/23 1756 02/01/23 0643  NA 138 137 137  --  139  --  136 140  K 3.5 3.8 3.7  --  3.1*  --  3.5 3.9  CL 101 105  --   --  106  --  108 110  CO2 26 15*  --   --  21*  --  16* 19*  GLUCOSE 118* 122*  --   --  87  --  131* 107*  BUN 13 13  --   --  14  --  19 22*  CREATININE 1.09 1.21  --  1.37* 2.21*  --  2.56* 2.51*  CALCIUM 9.1 9.1  --   --  8.8*  --  8.5* 8.2*  MG  --  2.5*  --   --  2.3  --  1.9 2.0  PHOS  --   --   --   --   --  1.7* 3.4 4.1    GFR: Estimated Creatinine Clearance: 57.3 mL/min (A) (by C-G formula based on SCr of 2.51 mg/dL (H)). Recent Labs  Lab 01/30/23 0310 01/30/23 0959 01/30/23 1202 01/30/23 1633 02/01/23 0643  WBC 10.1 20.5*  --  14.9* 8.5  LATICACIDVEN  --   --  3.8*  --   --      Liver Function Tests: Recent Labs  Lab 01/30/23 1047  AST 52*  ALT 31  ALKPHOS 54  BILITOT 0.9  PROT 8.7*  ALBUMIN 5.0    No results for input(s): "LIPASE", "AMYLASE" in the last 168 hours. No results for input(s): "AMMONIA" in the last 168 hours.  ABG    Component Value Date/Time   PHART 7.389 01/30/2023 1502   PCO2ART 30.9 (L) 01/30/2023 1502    PO2ART 388 (H) 01/30/2023 1502   HCO3 18.6 (L) 01/30/2023 1502   TCO2 20 (L) 01/30/2023 1502   ACIDBASEDEF 5.0 (H) 01/30/2023 1502   O2SAT 100 01/30/2023 1502     Coagulation Profile: No results for input(s): "INR", "PROTIME" in the last 168 hours.  Cardiac Enzymes: Recent Labs  Lab 01/30/23 1047 01/31/23 0724  CKTOTAL 1,465* 1,971*     HbA1C: Hgb A1c MFr Bld  Date/Time Value Ref Range Status  11/06/2022 08:28 AM 5.2 4.6 - 6.5 % Final    Comment:  Glycemic Control Guidelines for People with Diabetes:Non Diabetic:  <6%Goal of Therapy: <7%Additional Action Suggested:  >8%     CBG: Recent Labs  Lab 01/31/23 1959 01/31/23 2341 02/01/23 0413 02/01/23 0816 02/01/23 1118  GLUCAP 127* 119* 143* 117* 173*     Review of Systems:   As per history and HPI  Past Medical History:  He,  has a past medical history of Acute tonsillitis (04/07/2013), Seizures, and SYNCOPE, VASOVAGAL (07/05/2009).   Surgical History:   Past Surgical History:  Procedure Laterality Date   TONSILLECTOMY       Social History:   reports that he has been smoking. He uses smokeless tobacco. He reports current alcohol use of about 1.0 standard drink of alcohol per week. He reports that he does not use drugs.   Family History:  His family history includes Diabetes in his maternal grandfather, maternal grandmother, and mother; Hyperlipidemia in his mother; Hypertension in his father, paternal grandfather, and paternal grandmother; Leukemia in his maternal grandmother; Seizures in his maternal aunt.   Allergies No Known Allergies   Home Medications  Prior to Admission medications   Medication Sig Start Date End Date Taking? Authorizing Provider  acetaminophen (TYLENOL) 325 MG tablet Take 650 mg by mouth every 6 (six) hours as needed for moderate pain.   Yes [provider]  diazePAM, 15 MG Dose, (VALTOCO 15 MG DOSE) 2 x 7.5 MG/0.1ML LQPK Place 7.5 mg into the nose as needed (For  seizure lasting more than 2 minutes). 08/03/22  Yes Windell Norfolk, MD  lamoTRIgine (LAMICTAL XR) 50 MG 24 hour tablet Take 3 tablets (150 mg total) by mouth daily. 10/26/22 10/21/23 Yes Windell Norfolk, MD  Vitamin D, Ergocalciferol, (DRISDOL) 1.25 MG (50000 UNIT) CAPS capsule TAKE 1 CAPSULE BY MOUTH EVERY 7 DAYS 12/18/22  Yes Garnette Gunner, MD     Thalia Bloodgood Internal Medicine Resident PGY-3 [facility]

## 2023-02-01 NOTE — Progress Notes (Signed)
Continues to be encephalopathic after titrating of fentanyl and precedex, not following commands. Will restart precedex and start PO oxycodone. Differentials for continued encephalopathy post-ictal state vs secondary to sedating medications with AKI.   Dr. Denese Killings of PCCM and Dr. Melynda Ripple or neurology recommending MRI to rule out other etiologies for continued encepalopathy

## 2023-02-01 NOTE — Procedures (Addendum)
Patient Name: BLANDON OFFERDAHL  MRN: [id number]  Epilepsy Attending: Charlsie Quest  Referring Physician/Provider: Sanjuana Letters, PA-C  Duration: 01/31/2023 1605 to 02/01/2023 1529   Patient history: 31yo M with status epilepticus, getting eeg to evaluate for seizure.   Level of alertness:  comatose   AEDs during EEG study: LTG, LEV,   Technical aspects: This EEG study was done with scalp electrodes positioned according to the 10-20 International system of electrode placement. Electrical activity was reviewed with band pass filter of 1-70Hz , sensitivity of 7 uV/mm, display speed of 50mm/sec with a  notched filter applied as appropriate. EEG data were recorded continuously and digitally stored.  Video monitoring was available and reviewed as appropriate.   Description: EEG showed continuous generalized predominantly 5 to 7 Hz theta slowing admixed with intermittent generalized rhythmic 2 to 3 Hz delta slowing.  Hyperventilation and photic stimulation were not performed.   Event button was pressed on 02/01/2023 at 0754 for upper arm jerking. Concomitant EEG before, during and after the event did not show any EEG changes suggest seizure.   ABNORMALITY -Continuous slow, generalized   IMPRESSION: This study is suggestive of moderate to severe diffuse encephalopathy. No seizures or epileptiform discharges were seen throughout the recording.  Event button was pressed on 02/01/2023 at 0754 for upper arm jerking without concomitant EEG change.  This was most likely not an epileptic event.   Miro Balderson Annabelle Harman

## 2023-02-01 NOTE — Progress Notes (Signed)
eLink Physician-Brief Progress Note Patient Name: Joseph Cameron DOB: 01/04/1991 MRN: [id number]   Date of Service  02/01/2023  HPI/Events of Note  32 year old male that initially presented with status epilepticus, no clear Etiology was discovered on his initial CT or metabolic workup.  He was found to have an acute kidney injury and the status eventually abated but he has been persistently encephalopathic.  Family at bedside requesting results of MRI.  eICU Interventions  We discussed that the MRI does not reveal any clear reasons for a seizure and does not reveal any clear reason for ongoing encephalopathy.  We again discussed persistent encephalopathy potentially secondary to his acute kidney injury and ongoing medications.  All questions were answered to the patient's cousin who is going to relay this to the patient's mother.     Intervention Category Minor Interventions: Communication with other healthcare providers and/or family  Conrad Minatare 02/01/2023, 10:21 PM

## 2023-02-02 ENCOUNTER — Inpatient Hospital Stay (HOSPITAL_COMMUNITY): Payer: 59

## 2023-02-02 ENCOUNTER — Encounter: Payer: Self-pay | Admitting: Neurology

## 2023-02-02 DIAGNOSIS — J9601 Acute respiratory failure with hypoxia: Secondary | ICD-10-CM

## 2023-02-02 DIAGNOSIS — N179 Acute kidney failure, unspecified: Secondary | ICD-10-CM

## 2023-02-02 DIAGNOSIS — G40901 Epilepsy, unspecified, not intractable, with status epilepticus: Secondary | ICD-10-CM | POA: Diagnosis not present

## 2023-02-02 DIAGNOSIS — T17908A Unspecified foreign body in respiratory tract, part unspecified causing other injury, initial encounter: Secondary | ICD-10-CM

## 2023-02-02 DIAGNOSIS — J690 Pneumonitis due to inhalation of food and vomit: Secondary | ICD-10-CM

## 2023-02-02 LAB — URINALYSIS, COMPLETE (UACMP) WITH MICROSCOPIC
Bilirubin Urine: NEGATIVE
Glucose, UA: NEGATIVE mg/dL
Ketones, ur: NEGATIVE mg/dL
Leukocytes,Ua: NEGATIVE
Nitrite: NEGATIVE
Protein, ur: 100 mg/dL — AB
RBC / HPF: >50 RBC/hpf (ref 0–5)
Specific Gravity, Urine: 1.015 (ref 1.005–1.030)
pH: 5 (ref 5.0–8.0)

## 2023-02-02 LAB — GLUCOSE, CAPILLARY
Glucose-Capillary: 106 mg/dL — ABNORMAL HIGH (ref 70–99)
Glucose-Capillary: 107 mg/dL — ABNORMAL HIGH (ref 70–99)
Glucose-Capillary: 112 mg/dL — ABNORMAL HIGH (ref 70–99)
Glucose-Capillary: 113 mg/dL — ABNORMAL HIGH (ref 70–99)
Glucose-Capillary: 136 mg/dL — ABNORMAL HIGH (ref 70–99)
Glucose-Capillary: 142 mg/dL — ABNORMAL HIGH (ref 70–99)

## 2023-02-02 LAB — BASIC METABOLIC PANEL
Anion gap: 10 (ref 5–15)
BUN: 39 mg/dL — ABNORMAL HIGH (ref 6–20)
CO2: 21 mmol/L — ABNORMAL LOW (ref 22–32)
Calcium: 8 mg/dL — ABNORMAL LOW (ref 8.9–10.3)
Chloride: 109 mmol/L (ref 98–111)
Creatinine, Ser: 3.32 mg/dL — ABNORMAL HIGH (ref 0.61–1.24)
GFR, Estimated: 25 mL/min — ABNORMAL LOW (ref >60)
Glucose, Bld: 172 mg/dL — ABNORMAL HIGH (ref 70–99)
Potassium: 4.3 mmol/L (ref 3.5–5.1)
Sodium: 140 mmol/L (ref 135–145)

## 2023-02-02 LAB — CBC
HCT: 35.1 % — ABNORMAL LOW (ref 39.0–52.0)
Hemoglobin: 11.7 g/dL — ABNORMAL LOW (ref 13.0–17.0)
MCH: 28.7 pg (ref 26.0–34.0)
MCHC: 33.3 g/dL (ref 30.0–36.0)
MCV: 86 fL (ref 80.0–100.0)
Platelets: 171 10*3/uL (ref 150–400)
RBC: 4.08 MIL/uL — ABNORMAL LOW (ref 4.22–5.81)
RDW: 12.4 % (ref 11.5–15.5)
WBC: 8.3 10*3/uL (ref 4.0–10.5)
nRBC: 0 % (ref 0.0–0.2)

## 2023-02-02 LAB — CULTURE, RESPIRATORY W GRAM STAIN

## 2023-02-02 LAB — CK: Total CK: 2704 U/L — ABNORMAL HIGH (ref 49–397)

## 2023-02-02 LAB — MAGNESIUM: Magnesium: 1.7 mg/dL (ref 1.7–2.4)

## 2023-02-02 LAB — CULTURE, BLOOD (ROUTINE X 2): Special Requests: ADEQUATE

## 2023-02-02 MED ORDER — LEVETIRACETAM IN NACL 500 MG/100ML IV SOLN
500.0000 mg | Freq: Two times a day (BID) | INTRAVENOUS | Status: DC
  Administered 2023-02-02: 500 mg via INTRAVENOUS
  Filled 2023-02-02: qty 100

## 2023-02-02 MED ORDER — PIPERACILLIN-TAZOBACTAM 3.375 G IVPB
3.3750 g | Freq: Three times a day (TID) | INTRAVENOUS | Status: DC
  Administered 2023-02-02 – 2023-02-04 (×6): 3.375 g via INTRAVENOUS
  Filled 2023-02-02 (×7): qty 50

## 2023-02-02 MED ORDER — HALOPERIDOL LACTATE 5 MG/ML IJ SOLN
2.0000 mg | Freq: Four times a day (QID) | INTRAMUSCULAR | Status: DC | PRN
  Administered 2023-02-02: 2 mg via INTRAVENOUS
  Filled 2023-02-02: qty 1

## 2023-02-02 MED ORDER — FAMOTIDINE 20 MG PO TABS: 20.0000 mg | ORAL_TABLET | Freq: Every day | ORAL | Status: DC

## 2023-02-02 MED ORDER — ORAL CARE MOUTH RINSE: 15.0000 mL | OROMUCOSAL | Status: DC | PRN

## 2023-02-02 MED ORDER — MAGNESIUM SULFATE 2 GM/50ML IV SOLN
2.0000 g | Freq: Once | INTRAVENOUS | Status: AC
  Administered 2023-02-02: 2 g via INTRAVENOUS
  Filled 2023-02-02: qty 50

## 2023-02-02 MED ORDER — ACETAMINOPHEN 10 MG/ML IV SOLN
1000.0000 mg | Freq: Once | INTRAVENOUS | Status: AC
  Administered 2023-02-02: 1000 mg via INTRAVENOUS
  Filled 2023-02-02: qty 100

## 2023-02-02 MED ORDER — LACTATED RINGERS IV SOLN: INTRAVENOUS | Status: AC

## 2023-02-02 MED ORDER — POLYETHYLENE GLYCOL 3350 17 G PO PACK
17.0000 g | PACK | Freq: Two times a day (BID) | ORAL | Status: DC
  Administered 2023-02-02: 17 g via ORAL
  Filled 2023-02-02 (×2): qty 1

## 2023-02-02 MED ORDER — SENNOSIDES-DOCUSATE SODIUM 8.6-50 MG PO TABS
2.0000 | ORAL_TABLET | Freq: Two times a day (BID) | ORAL | Status: DC
  Administered 2023-02-02: 2 via ORAL
  Filled 2023-02-02: qty 2

## 2023-02-02 MED ORDER — LAMOTRIGINE 100 MG PO TABS
100.0000 mg | ORAL_TABLET | Freq: Two times a day (BID) | ORAL | Status: DC
  Administered 2023-02-02 – 2023-02-05 (×6): 100 mg via ORAL
  Filled 2023-02-02 (×8): qty 1

## 2023-02-02 MED ORDER — LEVETIRACETAM IN NACL 500 MG/100ML IV SOLN
500.0000 mg | Freq: Once | INTRAVENOUS | Status: AC
  Administered 2023-02-02: 500 mg via INTRAVENOUS
  Filled 2023-02-02: qty 100

## 2023-02-02 MED ORDER — ACETAMINOPHEN 160 MG/5ML PO SOLN
650.0000 mg | Freq: Four times a day (QID) | ORAL | Status: DC | PRN
  Administered 2023-02-02 – 2023-02-04 (×4): 650 mg via ORAL
  Filled 2023-02-02 (×4): qty 20.3

## 2023-02-02 MED ORDER — ONDANSETRON HCL 4 MG/2ML IJ SOLN
INTRAMUSCULAR | Status: AC
  Administered 2023-02-02: 4 mg
  Filled 2023-02-02: qty 2

## 2023-02-02 NOTE — TOC Initial Note (Signed)
Transition of Care [facility]) - Initial/Assessment Note    Patient Details  Name: Joseph Cameron MRN: [id number] Date of Birth: [birth year]  [facility] of [facility]) CM/SW Contact:    Joseph Cousin, RN Phone Number: [phone] 02/02/2023, 2:15 PM  Clinical Narrative:                 CM spoke to pt's fiance. States pt is independent PTA. She received note for work for pt. Will continue to follow for dc needs.   Expected Discharge Plan: Home/Self Care Barriers to Discharge: Continued Medical Work up   Patient Goals and CMS Choice Patient states their goals for this hospitalization and ongoing recovery are:: wants him to recover          Expected Discharge Plan and Services   Discharge Planning Services: CM Consult   Living arrangements for the past 2 months: Single Family Home                                      Prior Living Arrangements/Services Living arrangements for the past 2 months: Single Family Home Lives with:: Significant Other Patient language and need for interpreter reviewed:: Yes        Need for Family Participation in Patient Care: No (Comment) Care giver support system in place?: Yes (comment)   Criminal Activity/Legal Involvement Pertinent to Current Situation/Hospitalization: No - Comment as needed  Activities of Daily Living      Permission Sought/Granted Permission sought to share information with : Case Manager, Family Supports Permission granted to share information with : Yes, Verbal Permission Granted  Share Information with NAME: Joseph Cameron     Permission granted to share info w Relationship: SO  Permission granted to share info w Contact Information: [phone]  Emotional Assessment              Admission diagnosis:  Status epilepticus [G40.901] Acute respiratory failure with hypoxia [J96.01] Patient Active Problem List   Diagnosis Date Noted   Acute respiratory failure with hypoxia 02/02/2023   AKI  (acute kidney injury) 02/02/2023   Aspiration into airway 02/02/2023   Status epilepticus 01/30/2023   Screening, lipid 11/03/2022   Chest pain 11/03/2022   Vitamin D deficiency 11/03/2022   Tobacco use 11/03/2022   Attention deficit hyperactivity disorder (ADHD) 03/30/2016   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   PCP:  Joseph Gunner, MD Pharmacy:   [id number] - [place], [place] - [address] [place] [address] Phone: [phone] Fax: [phone]  [id number] - [place], [place] - [address] AT [facility] [address] & [address] [place]  [address] Phone: [phone] Fax: [phone]     Social Determinants of Health (SDOH) Social History: SDOH Screenings   Food Insecurity: No Food Insecurity (02/02/2023)  Housing: Low Risk  (02/02/2023)  Transportation Needs: No Transportation Needs (02/02/2023)  Utilities: Not At Risk (02/02/2023)  Depression (PHQ2-9): Low Risk  (11/03/2022)  Tobacco Use: High Risk (01/30/2023)   SDOH Interventions: Food Insecurity Interventions: Intervention Not Indicated Housing Interventions: Intervention Not Indicated Transportation Interventions: Intervention Not Indicated Utilities Interventions: Intervention Not Indicated   Readmission Risk Interventions     No data to display

## 2023-02-02 NOTE — Progress Notes (Signed)
Subjective: NAEO. Extubated.   ROS: Unable to obtain due to poor mental status  Examination  Vital signs in last 24 hours: Temp:  [96.5 F (35.8 C)-101.1 F (38.4 C)] 98.5 F (36.9 C) (04/19 1105) Pulse Rate:  [73-107] 86 (04/19 1200) Resp:  [11-26] 17 (04/19 1200) BP: (90-139)/(54-97) 132/97 (04/19 1200) SpO2:  [92 %-100 %] 94 % (04/19 1200) FiO2 (%):  [40 %] 40 % (04/19 0800) Weight:  [110 kg] 110 kg (04/19 0500)  General: lying in bed, NAD Neuro: drowsy, opens eyes to repeated tactile stimulation, able to tell me his name, says that he is at hospital and its April after asking repeatedly and giving multiple options, PERLA, EOMI, No facial asymmetry, antigravity strength in all extremities.  Basic Metabolic Panel: Recent Labs  Lab 01/30/23 1047 01/30/23 1502 01/30/23 1633 01/31/23 0724 01/31/23 0909 01/31/23 1756 02/01/23 0643 02/01/23 1929 02/02/23 0438  NA 137 137  --  139  --  136 140  --  140  K 3.8 3.7  --  3.1*  --  3.5 3.9  --  4.3  CL 105  --   --  106  --  108 110  --  109  CO2 15*  --   --  21*  --  16* 19*  --  21*  GLUCOSE 122*  --   --  87  --  131* 107*  --  172*  BUN 13  --   --  14  --  19 22*  --  39*  CREATININE 1.21  --  1.37* 2.21*  --  2.56* 2.51*  --  3.32*  CALCIUM 9.1  --   --  8.8*  --  8.5* 8.2*  --  8.0*  MG 2.5*  --   --  2.3  --  1.9 2.0 1.7 1.7  PHOS  --   --   --   --  1.7* 3.4 4.1 4.9*  --     CBC: Recent Labs  Lab 01/30/23 0310 01/30/23 0959 01/30/23 1502 01/30/23 1633 02/01/23 0643 02/02/23 0438  WBC 10.1 20.5*  --  14.9* 8.5 8.3  NEUTROABS 7.1 15.4*  --   --   --   --   HGB 15.2 16.8 13.9 14.9 13.8 11.7*  HCT 44.7 53.1* 41.0 43.7 39.5 35.1*  MCV 85.5 91.6  --  85.4 84.0 86.0  PLT 212 251  --  228 188 171     Coagulation Studies: No results for input(s): "LABPROT", "INR" in the last 72 hours.  Imaging MRI Brain wo contrast 02/01/2023: No acute abnormality   ASSESSMENT AND PLAN:  32 year old male with history of  epilepsy on lamotrigine presented with breakthrough seizure, no clear etiology.   Epilepsy breakthrough seizure -No clear etiology.  Lamotrigine level is pending.  Family states patient does smoke marijuana at times due to stress.   Recommendations -Continue lamotrigine 100 mg twice daily (home dose is lamotrigine 150 mg extended strength daily) -DC keppra -Rescue : intranasal valtoc0  for sz lasting over 2 minutes - Continue seizure precautions - prn iv versed for clinical seizures - F/u with Dr Teresa Coombs in 4-6 weeks  Seizure precautions: Per [place] statutes, patients with seizures are not allowed to drive until they have been seizure-free for six months and cleared by a physician    Use caution when using heavy equipment or power tools. Avoid working on ladders or at heights. Take showers instead of baths. Ensure the water  temperature is not too high on the home water heater. Do not go swimming alone. Do not lock yourself in a room alone (i.e. bathroom). When caring for infants or small children, sit down when holding, feeding, or changing them to minimize risk of injury to the child in the event you have a seizure. Maintain good sleep hygiene. Avoid alcohol.    If patient has another seizure, call 911 and bring them back to the ED if: A.  The seizure lasts longer than 5 minutes.      B.  The patient doesn't wake shortly after the seizure or has new problems such as difficulty seeing, speaking or moving following the seizure C.  The patient was injured during the seizure D.  The patient has a temperature over 102 F (39C) E.  The patient vomited during the seizure and now is having trouble breathing    During the Seizure   - First, ensure adequate ventilation and place patients on the floor on their left side  Loosen clothing around the neck and ensure the airway is patent. If the patient is clenching the teeth, do not force the mouth open with any object as this can cause  severe damage - Remove all items from the surrounding that can be hazardous. The patient may be oblivious to what's happening and may not even know what he or she is doing. If the patient is confused and wandering, either gently guide him/her away and block access to outside areas - Reassure the individual and be comforting - Call 911. In most cases, the seizure ends before EMS arrives. However, there are cases when seizures may last over 3 to 5 minutes. Or the individual may have developed breathing difficulties or severe injuries. If a pregnant patient or a person with diabetes develops a seizure, it is prudent to call an ambulance. - Finally, if the patient does not regain full consciousness, then call EMS. Most patients will remain confused for about 45 to 90 minutes after a seizure, so you must use judgment in calling for help.    After the Seizure (Postictal Stage)   After a seizure, most patients experience confusion, fatigue, muscle pain and/or a headache. Thus, one should permit the individual to sleep. For the next few days, reassurance is essential. Being calm and helping reorient the person is also of importance.   Most seizures are painless and end spontaneously. Seizures are not harmful to others but can lead to complications such as stress on the lungs, brain and the heart. Individuals with prior lung problems may develop labored breathing and respiratory distress.    I have spent a total of  36  minutes with the patient reviewing hospital notes,  test results, labs and examining the patient as well as establishing an assessment and plan.  > 50% of time was spent in direct patient care.    Lindie Spruce Epilepsy [facility] For questions after 5pm please refer to AMION to reach the Neurologist on call

## 2023-02-02 NOTE — Procedures (Signed)
Extubation Procedure Note  Patient Details:   Name: Joseph Cameron DOB: 1991-05-19 MRN: [id number]   Airway Documentation:    Vent end date: 02/02/23 Vent end time: 1024   Evaluation  O2 sats: stable throughout Complications: No apparent complications Patient did tolerate procedure well. Bilateral Breath Sounds: Clear, Diminished   Yes Pt was extubated per MD order and placed on 3 L [place]. Cuff leak was noted prior to extubation and no stridor post. Pt is stable at this time. Rt will monitor.   Merlene Laughter 02/02/2023, 10:24 AM

## 2023-02-02 NOTE — Progress Notes (Signed)
eLink Physician-Brief Progress Note Patient Name: Joseph Cameron DOB: [birth year] MRN: [id number]   Date of Service  02/02/2023  HPI/Events of Note  32 year old man with a history of epilepsy, admitted with status epilepticus. Course complicated by left lower lobe aspiration pneumonia. Remained encephalopathic even after seizures resolved clinically and on LTM EEG   He is persistently encephalopathic and somewhat agitated today try to get out of bed.  eICU Interventions  Add on as needed Haldol     Intervention Category Minor Interventions: Agitation / anxiety - evaluation and management  [name] 02/02/2023, 11:07 PM

## 2023-02-02 NOTE — Progress Notes (Addendum)
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 He has been adherent to his home lamictal and followed by Dr. Olegario Messier at [facility] neurology. Noted to have a seizure at about 1 AM 01/30/2023-lasted less than 1 minute came to the emergency department was treated observed and discharged. He was back to his baseline when he was discharged   Another seizure was noted about 7:30am and then another 1 about 8:30am and EMS was called.   While in the emergency department had a seizure at 10 am lasting about 8 to 9 minutes, received 4 mg of Ativan. Was then intubated and placed on a ventilator. Started on propofol, fentanyl and loaded with keppra. Family denied recent illness or new medications. Patient has had breakthrough seizures in the past with increased stressors, family note has had increase stress over last few days.   Pertinent  Medical History  Epilepsy   Significant Hospital Events: Including procedures, antibiotic start and stop dates in addition to other pertinent events   Admitted 4/17 for breakthrough seizures, intubated for airway protection 4/17 febrile, chest xray with evidence of left lower lobe aspiration pneumonia - started on ceftriaxone and azithromycin 4/18 MRI brain w/o acute findings or changes 4/19 antibiotics changed to zosyn  Interim History / Subjective:   No acute events overnight. This morning continued to be encephalopathic with weaning of sedatives, followed some commands for his nurse. Actively vomiting this morning.   Objective   Blood pressure 138/85, pulse (!) 107, temperature (!) 101.1 F (38.4 C), temperature source Esophageal, resp. rate 19, weight 110 kg, SpO2 96 %.    Vent Mode:  PSV;CPAP FiO2 (%):  [40 %] 40 % Set Rate:  [18 bmp] 18 bmp Vt Set:  [680 mL] 680 mL PEEP:  [5 cmH20] 5 cmH20 Pressure Support:  [5 cmH20-8 cmH20] 8 cmH20 Plateau Pressure:  [15 cmH20-19 cmH20] 15 cmH20   Intake/Output Summary (Last 24 hours) at 02/02/2023 0849 Last data filed at 02/02/2023 0830 Gross per 24 hour  Intake 1383.07 ml  Output 1070 ml  Net 313.07 ml    Filed Weights   01/30/23 0944 02/01/23 0702 02/02/23 0500  Weight: 103 kg 106.2 kg 110 kg   Examination: General: Ill appaering HENT: ET tube in place with white purulent material in suction. 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 Not requiring further EEG. Unclear etiology for breakthrough seizure, possible increase life stressors. Neurology not mentions marijuana use.  -appreciate neurology assistance -continue keppra BID and lamictal -wean sedatives  -lamotrigine level of 2.0 on admission  Acute encephalopathy - multifactorial Likely secondary to sedating medications, possibly post-ictal. Mentation improved this morning. Using precedex PRN while working to extubate.   -monitor and wean medication as able   Left lower lobe aspiration pneumonia Initially started on azithromycin and ceftriaxone, will transition to zosyn today with continued vomiting and heavy secretions. Tracheal aspirate with moderate gram negative rods.  -transition to  zosyn day 1/7 -follow cultures, narrow as able -zosyn for continued vomiting, hold tube feeds -urine strep pending  Acute kidney injury Suspect pre-renal etiology, with worsening Cr to 3.3 possible component of ATN.  Pending UA  and renal US. Will restart maintenance fluids -follow up UA and renal US -LR infusion 125 cc/hr   Acute hypoxic respiratory failure Secondary to sedating medications. Continues to have vomiting with concern for aspiration. Will continue SBT as able and attempt to wean and extubate today depending on how he does this morning -wean sedatives -VAP protocol and prevention -extubate when able  Anion gap metabolic acidosis resolved  Electrolyte abnormalities Electrolytes stable  Normocytic anemia Continue to follow, no obvious acute bleed and hemodynamically stable   Best Practice (right click and "Reselect all SmartList Selections" daily)   Diet/type: holding tube feeds, NPO until 4 hours after extubation DVT prophylaxis: LMWH GI prophylaxis: H2B Lines: N/A Foley:  foley catheter placed secondary to retention Code Status:  full code Last date of multidisciplinary goals of care discussion - family updated at bedside  Labs   CBC: Recent Labs  Lab 01/30/23 0310 01/30/23 0959 01/30/23 1502 01/30/23 1633 02/01/23 0643 02/02/23 0438  WBC 10.1 20.5*  --  14.9* 8.5 8.3  NEUTROABS 7.1 15.4*  --   --   --   --   HGB 15.2 16.8 13.9 14.9 13.8 11.7*  HCT 44.7 53.1* 41.0 43.7 39.5 35.1*  MCV 85.5 91.6  --  85.4 84.0 86.0  PLT 212 251  --  228 188 171     Basic Metabolic Panel: Recent Labs  Lab 01/30/23 1047 01/30/23 1502 01/30/23 1633 01/31/23 0724 01/31/23 0909 01/31/23 1756 02/01/23 0643 02/01/23 1929 02/02/23 0438  NA 137 137  --  139  --  136 140  --  140  K 3.8 3.7  --  3.1*  --  3.5 3.9  --  4.3  CL 105  --   --  106  --  108 110  --  109  CO2 15*  --   --  21*  --  16* 19*  --  21*  GLUCOSE 122*  --   --  87  --  131* 107*  --  172*  BUN 13  --   --  14  --  19 22*  --  39*  CREATININE 1.21  --  1.37* 2.21*  --  2.56* 2.51*  --  3.32*  CALCIUM 9.1  --   --  8.8*  --  8.5* 8.2*  --  8.0*  MG 2.5*  --   --  2.3  --  1.9 2.0 1.7 1.7  PHOS  --   --   --   --  1.7*  3.4 4.1 4.9*  --     GFR: Estimated Creatinine Clearance: 44 mL/min (A) (by C-G formula based on SCr of 3.32 mg/dL (H)). Recent Labs  Lab 01/30/23 0959 01/30/23 1202 01/30/23 1633 02/01/23 0643 02/02/23 0438  WBC 20.5*  --  14.9* 8.5 8.3  LATICACIDVEN  --  3.8*  --   --   --      Liver Function Tests: Recent Labs  Lab 01/30/23 1047  AST 52*  ALT 31  ALKPHOS 54  BILITOT 0.9  PROT 8.7*  ALBUMIN 5.0    No results for input(s): "LIPASE", "AMYLASE" in the last 168 hours. No results for input(s): "AMMONIA" in the last 168 hours.  ABG    Component Value  Date/Time   PHART 7.389 01/30/2023 1502   PCO2ART 30.9 (L) 01/30/2023 1502   PO2ART 388 (H) 01/30/2023 1502   HCO3 18.6 (L) 01/30/2023 1502   TCO2 20 (L) 01/30/2023 1502   ACIDBASEDEF 5.0 (H) 01/30/2023 1502   O2SAT 100 01/30/2023 1502     Coagulation Profile: No results for input(s): "INR", "PROTIME" in the last 168 hours.  Cardiac Enzymes: Recent Labs  Lab 01/30/23 1047 01/31/23 0724  CKTOTAL 1,465* 1,971*     HbA1C: Hgb A1c MFr Bld  Date/Time Value Ref Range Status  11/06/2022 08:28 AM 5.2 4.6 - 6.5 % Final    Comment:    Glycemic Control Guidelines for People with Diabetes:Non Diabetic:  <6%Goal of Therapy: <7%Additional Action Suggested:  >8%     CBG: Recent Labs  Lab 02/01/23 1601 02/01/23 1927 02/01/23 2325 02/02/23 0331 02/02/23 0754  GLUCAP 113* 119* 120* 136* 142*     Review of Systems:   As per history and HPI  Past Medical History:  He,  has a past medical history of Acute tonsillitis (04/07/2013), Seizures, and SYNCOPE, VASOVAGAL (07/05/2009).   Surgical History:   Past Surgical History:  Procedure Laterality Date   TONSILLECTOMY       Social History:   reports that he has been smoking. He uses smokeless tobacco. He reports current alcohol use of about 1.0 standard drink of alcohol per week. He reports that he does not use drugs.   Family History:  His family history  includes Diabetes in his maternal grandfather, maternal grandmother, and mother; Hyperlipidemia in his mother; Hypertension in his father, paternal grandfather, and paternal grandmother; Leukemia in his maternal grandmother; Seizures in his maternal aunt.   Allergies No Known Allergies   Home Medications  Prior to Admission medications   Medication Sig Start Date End Date Taking? Authorizing Provider  acetaminophen (TYLENOL) 325 MG tablet Take 650 mg by mouth every 6 (six) hours as needed for moderate pain.   Yes [provider]  diazePAM, 15 MG Dose, (VALTOCO 15 MG DOSE) 2 x 7.5 MG/0.1ML LQPK Place 7.5 mg into the nose as needed (For seizure lasting more than 2 minutes). 08/03/22  Yes Windell Norfolk, MD  lamoTRIgine (LAMICTAL XR) 50 MG 24 hour tablet Take 3 tablets (150 mg total) by mouth daily. 10/26/22 10/21/23 Yes Windell Norfolk, MD  Vitamin D, Ergocalciferol, (DRISDOL) 1.25 MG (50000 UNIT) CAPS capsule TAKE 1 CAPSULE BY MOUTH EVERY 7 DAYS 12/18/22  Yes Garnette Gunner, MD     Thalia Bloodgood Internal Medicine Resident PGY-3 [facility]

## 2023-02-02 NOTE — Progress Notes (Signed)
  [facility] [address] [place], [place]  [address]  February 02, 2023  To Whom it May Concern:  Mr Topper was admitted to [facility] for management of his epilepsy on 01/30/2023. Please excuse him from work due to hospitalization. I also recommend upto 1 week off from work after discharge for recovery.  Due to his diagnosis of epilepsy, I recommend Mr Ponciano has consistent work hours ( ideally morning) as shift work can disrupt sleep and adversely affect seizure control.  Please dont hesitate to contact me for any further questions.  Sincerely,  Charlsie Quest, MD 02/02/2023, 1:15 PM

## 2023-02-03 DIAGNOSIS — T17908A Unspecified foreign body in respiratory tract, part unspecified causing other injury, initial encounter: Secondary | ICD-10-CM | POA: Diagnosis not present

## 2023-02-03 DIAGNOSIS — N179 Acute kidney failure, unspecified: Secondary | ICD-10-CM | POA: Diagnosis not present

## 2023-02-03 DIAGNOSIS — G40901 Epilepsy, unspecified, not intractable, with status epilepticus: Secondary | ICD-10-CM | POA: Diagnosis not present

## 2023-02-03 LAB — CBC
HCT: 32.3 % — ABNORMAL LOW (ref 39.0–52.0)
Hemoglobin: 11.2 g/dL — ABNORMAL LOW (ref 13.0–17.0)
MCH: 29.5 pg (ref 26.0–34.0)
MCHC: 34.7 g/dL (ref 30.0–36.0)
MCV: 85 fL (ref 80.0–100.0)
Platelets: 179 10*3/uL (ref 150–400)
RBC: 3.8 MIL/uL — ABNORMAL LOW (ref 4.22–5.81)
RDW: 12.2 % (ref 11.5–15.5)
WBC: 8.4 10*3/uL (ref 4.0–10.5)
nRBC: 0 % (ref 0.0–0.2)

## 2023-02-03 LAB — GLUCOSE, CAPILLARY
Glucose-Capillary: 101 mg/dL — ABNORMAL HIGH (ref 70–99)
Glucose-Capillary: 106 mg/dL — ABNORMAL HIGH (ref 70–99)
Glucose-Capillary: 87 mg/dL (ref 70–99)
Glucose-Capillary: 96 mg/dL (ref 70–99)

## 2023-02-03 LAB — BASIC METABOLIC PANEL
Anion gap: 10 (ref 5–15)
BUN: 35 mg/dL — ABNORMAL HIGH (ref 6–20)
CO2: 20 mmol/L — ABNORMAL LOW (ref 22–32)
Calcium: 8.1 mg/dL — ABNORMAL LOW (ref 8.9–10.3)
Chloride: 106 mmol/L (ref 98–111)
Creatinine, Ser: 2.56 mg/dL — ABNORMAL HIGH (ref 0.61–1.24)
GFR, Estimated: 34 mL/min — ABNORMAL LOW (ref >60)
Glucose, Bld: 114 mg/dL — ABNORMAL HIGH (ref 70–99)
Potassium: 4 mmol/L (ref 3.5–5.1)
Sodium: 136 mmol/L (ref 135–145)

## 2023-02-03 LAB — CULTURE, RESPIRATORY W GRAM STAIN: Gram Stain: NONE SEEN

## 2023-02-03 LAB — STREP PNEUMONIAE URINARY ANTIGEN: Strep Pneumo Urinary Antigen: NEGATIVE

## 2023-02-03 MED ORDER — LOPERAMIDE HCL 1 MG/7.5ML PO SUSP
1.0000 mg | Freq: Once | ORAL | Status: AC
  Administered 2023-02-03: 1 mg via ORAL
  Filled 2023-02-03: qty 7.5

## 2023-02-03 MED ORDER — PHENOL 1.4 % MT LIQD: 1.0000 | OROMUCOSAL | Status: DC | PRN

## 2023-02-03 MED ORDER — LACTATED RINGERS IV SOLN: INTRAVENOUS | Status: DC

## 2023-02-03 NOTE — Evaluation (Signed)
Physical Therapy Evaluation Patient Details Name: Joseph Cameron MRN: [id number] DOB: [birth year] Today's Date: 02/03/2023  History of Present Illness  Pt is 32 yo male admitted 01/30/23 with breakthrough seizure and was intubated for airway protection.  ETT 01/30/23-02/02/23.  Pt with hx of seizures.  Clinical Impression  Pt admitted with above diagnosis. At baseline, pt independent and working.  Pt's fiance will be available next week at home to assist as needed.  Today, pt was supervision to sit and ambulated a few feet with min A to steady without AD.  His strength was good but did demonstrate decreased coordination in all extremities.  Pt with mild impulsivity , particularly with lines and did fatigue easily.  However, for first time up, did well and is expected to progress very well and quickly with therapy. Do recommend supervision initially at home and may benefit from RW for improve stability.  Discussed with family.   Pt currently with functional limitations due to the deficits listed below (see PT Problem List). Pt will benefit from acute skilled PT to increase their independence and safety with mobility to allow discharge.  Pt could benefit from further therapy at d/c to return to baseline.         Recommendations for follow up therapy are one component of a multi-disciplinary discharge planning process, led by the attending physician.  Recommendations may be updated based on patient status, additional functional criteria and insurance authorization.  Follow Up Recommendations       Assistance Recommended at Discharge Intermittent Supervision/Assistance  Patient can return home with the following  A little help with walking and/or transfers;A little help with bathing/dressing/bathroom;Assistance with cooking/housework;Help with stairs or ramp for entrance    Equipment Recommendations Rolling walker (2 wheels)  Recommendations for Other Services       Functional Status  Assessment Patient has had a recent decline in their functional status and demonstrates the ability to make significant improvements in function in a reasonable and predictable amount of time.     Precautions / Restrictions Precautions Precautions: Fall      Mobility  Bed Mobility Overal bed mobility: Needs Assistance Bed Mobility: Supine to Sit     Supine to sit: Min guard          Transfers Overall transfer level: Needs assistance Equipment used: 1 person hand held assist Transfers: Sit to/from Stand Sit to Stand: Min assist, +2 safety/equipment           General transfer comment: Light min A to steady initially; +2 helpful for lines    Ambulation/Gait Ambulation/Gait assistance: Min assist, +2 safety/equipment Gait Distance (Feet): 8 Feet Assistive device: 1 person hand held assist Gait Pattern/deviations: Step-through pattern, Decreased stride length, Staggering right, Staggering left Gait velocity: normal     General Gait Details: Pt with unsteadiness  R and L with min A to steady; cues to keep eyes open; Pt declined further at this time due to feeling bad  Stairs            Wheelchair Mobility    Modified Rankin (Stroke Patients Only)       Balance Overall balance assessment: Needs assistance Sitting-balance support: No upper extremity supported Sitting balance-Leahy Scale: Good     Standing balance support: No upper extremity supported Standing balance-Leahy Scale: Fair                               Pertinent Vitals/Pain  Pain Assessment Pain Assessment: No/denies pain    Home Living Family/patient expects to be discharged to:: Private residence Living Arrangements: Spouse/significant other Available Help at Discharge: Family;Available 24 hours/day Type of Home: House Home Access: Level entry       Home Layout: One level Home Equipment: None      Prior Function Prior Level of Function : Independent/Modified  Independent             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        Extremity/Trunk Assessment   Upper Extremity Assessment Upper Extremity Assessment: LUE deficits/detail;RUE deficits/detail RUE Deficits / Details: ROM WFL; MMT 5/5; coordination - decreased with finger to nose LUE Deficits / Details: ROM WFL; MMT 5/5; coordination - decreased with finger to nose    Lower Extremity Assessment Lower Extremity Assessment: LLE deficits/detail;RLE deficits/detail RLE Deficits / Details: ROM WFL; MMT 5/5; coordination decreased with heel/shin LLE Deficits / Details: ROM WFL; MMT 5/5; coordination decreased with heel/shin    Cervical / Trunk Assessment Cervical / Trunk Assessment: Normal  Communication      Cognition Arousal/Alertness: Awake/alert Behavior During Therapy: Impulsive Overall Cognitive Status: Within Functional Limits for tasks assessed                                 General Comments: Overall cognition intact - mildly impulsive with lines        General Comments General comments (skin integrity, edema, etc.): VSS    Exercises     Assessment/Plan    PT Assessment Patient needs continued PT services  PT Problem List Decreased coordination;Decreased range of motion;Decreased activity tolerance;Decreased safety awareness;Decreased balance;Decreased mobility;Decreased knowledge of use of DME       PT Treatment Interventions DME instruction;Therapeutic exercise;Gait training;Stair training;Functional mobility training;Therapeutic activities;Patient/family education;Balance training    PT Goals (Current goals can be found in the Care Plan section)  Acute Rehab PT Goals Patient Stated Goal: return home PT Goal Formulation: With patient/family Time For Goal Achievement: 02/17/23 Potential to Achieve Goals: Good Additional Goals Additional Goal #1: Will score >19 on DGI to indicate low fall risk    Frequency Min  4X/week     Co-evaluation               AM-PAC PT "6 Clicks" Mobility  Outcome Measure Help needed turning from your back to your side while in a flat bed without using bedrails?: A Little Help needed moving from lying on your back to sitting on the side of a flat bed without using bedrails?: A Little Help needed moving to and from a bed to a chair (including a wheelchair)?: A Little Help needed standing up from a chair using your arms (e.g., wheelchair or bedside chair)?: A Little Help needed to walk in hospital room?: A Little Help needed climbing 3-5 steps with a railing? : A Little 6 Click Score: 18    End of Session Equipment Utilized During Treatment: Gait belt Activity Tolerance: Patient tolerated treatment well Patient left: with chair alarm set;in chair;with call bell/phone within reach;with family/visitor present Nurse Communication: Mobility status PT Visit Diagnosis: Other abnormalities of gait and mobility (R26.89);Unsteadiness on feet (R26.81)    Time: 1610-9604 PT Time Calculation (min) (ACUTE ONLY): 31 min   Charges:   PT Evaluation $PT Eval Low Complexity: 1 Low PT Treatments $Therapeutic Activity: 8-22 mins  Anise Salvo, PT Acute Rehab [facility] [phone]   Rayetta Humphrey 02/03/2023, 1:35 PM

## 2023-02-03 NOTE — Progress Notes (Signed)
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 He has been adherent to his home lamictal and followed by Dr. Olegario Messier at [facility] neurology. Noted to have a seizure at about 1 AM 01/30/2023-lasted less than 1 minute came to the emergency department was treated observed and discharged. He was back to his baseline when he was discharged   Another seizure was noted about 7:30am and then another 1 about 8:30am and EMS was called.   While in the emergency department had a seizure at 10 am lasting about 8 to 9 minutes, received 4 mg of Ativan. Was then intubated and placed on a ventilator. Started on propofol, fentanyl and loaded with keppra. Family denied recent illness or new medications. Patient has had breakthrough seizures in the past with increased stressors, family note has had increase stress over last few days.   Pertinent  Medical History  Epilepsy   Significant Hospital Events: Including procedures, antibiotic start and stop dates in addition to other pertinent events   Admitted 4/17 for breakthrough seizures, intubated for airway protection 4/17 febrile, chest xray with evidence of left lower lobe aspiration pneumonia - started on ceftriaxone and azithromycin 4/18 MRI brain w/o acute findings or changes 4/19 antibiotics changed to zosyn  Interim History / Subjective:   Has done well since extubation yesterday. Defervesced Mental status continued to improve. Received Haldol overnight for agitation  Objective   Blood pressure 128/68, pulse 77, temperature 98.5 F (36.9 C), temperature source Oral, resp. rate (!) 30, weight 108.3 kg, SpO2 94 %.        Intake/Output Summary (Last 24 hours) at 02/03/2023  0932 Last data filed at 02/03/2023 0700 Gross per 24 hour  Intake 3159.55 ml  Output 1225 ml  Net 1934.55 ml    Filed Weights   02/01/23 0702 02/02/23 0500 02/03/23 0500  Weight: 106.2 kg 110 kg 108.3 kg   Examination: General: Much better appearing young man, no distress HENT: No pallor, icterus, no JVD Lungs: Clear to auscultation Cardiovascular: regular rate and rhythm, no murmurs, gallops, or rubs Abdomen: soft, non-distended Extremities: warm, trace lower extremity edema Neuro: Alert, interactive, slow speech, good strength in all 4 extremities, nonfocal  Labs show normal electrolytes, creatinine decreased from 3.3-2.5, no leukocytosis, stable anemia  Resolved Hospital Problem list   Seizures  Assessment & Plan:   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 Not requiring further EEG. Unclear etiology for breakthrough seizure, possible increase life stressors. Neurology note mentions marijuana use. -lamotrigine level of 2.0 on admission -appreciate neurology assistance -continue Lamictal 100 twice daily, home dose is 150 mg extended -DC Keppra   Acute encephalopathy -postictal, also related to sedatives, improved -Advance diet Mobilize   Left lower lobe aspiration pneumonia Initially started on azithromycin and ceftriaxone, changed to Zosyn 4/19 due to persistent fever tracheal aspirate with moderate gram negative rods.  -transition to zosyn day 2/5 -follow cultures, narrow as able -urine strep pending  Acute kidney injury Suspect pre-renal etiology, with worsening Cr to 3.3 possible component of ATN.  Renal ultrasound did not show any hydronephrosis -LR infusion 50 cc/hr  -Allow orals  Acute hypoxic respiratory failure, resolved -Wean off oxygen  Best Practice (right click and "Reselect all SmartList Selections" daily)   Diet/type: diet DVT prophylaxis: LMWH GI prophylaxis: H2B Lines:  N/A Foley:  foley catheter placed secondary to retention Code Status:  full code Last date of multidisciplinary goals of care discussion - family updated at bedside  Labs   CBC: Recent Labs  Lab 01/30/23 0310 01/30/23 0959 01/30/23 1502 01/30/23 1633 02/01/23 0643 02/02/23 0438 02/03/23 0051  WBC 10.1 20.5*  --  14.9* 8.5 8.3 8.4  NEUTROABS 7.1 15.4*  --   --   --   --   --   HGB 15.2 16.8 13.9 14.9 13.8 11.7* 11.2*  HCT 44.7 53.1* 41.0 43.7 39.5 35.1* 32.3*  MCV 85.5 91.6  --  85.4 84.0 86.0 85.0  PLT 212 251  --  228 188 171 179     Basic Metabolic Panel: Recent Labs  Lab 01/31/23 0724 01/31/23 0909 01/31/23 1756 02/01/23 0643 02/01/23 1929 02/02/23 0438 02/03/23 0051  NA 139  --  136 140  --  140 136  K 3.1*  --  3.5 3.9  --  4.3 4.0  CL 106  --  108 110  --  109 106  CO2 21*  --  16* 19*  --  21* 20*  GLUCOSE 87  --  131* 107*  --  172* 114*  BUN 14  --  19 22*  --  39* 35*  CREATININE 2.21*  --  2.56* 2.51*  --  3.32* 2.56*  CALCIUM 8.8*  --  8.5* 8.2*  --  8.0* 8.1*  MG 2.3  --  1.9 2.0 1.7 1.7  --   PHOS  --  1.7* 3.4 4.1 4.9*  --   --     GFR: Estimated Creatinine Clearance: 56.6 mL/min (A) (by C-G formula based on SCr of 2.56 mg/dL (H)). Recent Labs  Lab 01/30/23 1202 01/30/23 1633 02/01/23 0643 02/02/23 0438 02/03/23 0051  WBC  --  14.9* 8.5 8.3 8.4  LATICACIDVEN 3.8*  --   --   --   --      Liver Function Tests: Recent Labs  Lab 01/30/23 1047  AST 52*  ALT 31  ALKPHOS 54  BILITOT 0.9  PROT 8.7*  ALBUMIN 5.0    No results for input(s): "LIPASE", "AMYLASE" in the last 168 hours. No results for input(s): "AMMONIA" in the last 168 hours.  ABG    Component Value Date/Time   PHART 7.389 01/30/2023 1502   PCO2ART 30.9 (L) 01/30/2023 1502   PO2ART 388 (H) 01/30/2023 1502   HCO3 18.6 (L) 01/30/2023 1502   TCO2 20 (L) 01/30/2023 1502   ACIDBASEDEF 5.0 (H) 01/30/2023 1502   O2SAT 100 01/30/2023 1502     Coagulation Profile: No  results for input(s): "INR", "PROTIME" in the last 168 hours.  Cardiac Enzymes: Recent Labs  Lab 01/30/23 1047 01/31/23 0724 02/02/23 0438  CKTOTAL 1,465* 1,971* 2,704*     HbA1C: Hgb A1c MFr Bld  Date/Time Value Ref Range Status  11/06/2022 08:28 AM 5.2 4.6 - 6.5 % Final    Comment:    Glycemic Control Guidelines for People with Diabetes:Non Diabetic:  <6%Goal of Therapy: <7%Additional Action Suggested:  >8%     CBG: Recent Labs  Lab 02/02/23 1104 02/02/23 1553 02/02/23 1944 02/02/23 2342 02/03/23 0834  GLUCAP 107* 113* 112* 106* 101*    Cyril Mourning MD. FCCP. [facility] Pager : [phone]  If no response to pager ,  please call [phone] until 7 pm After 7:00 pm call Elink  [phone]   02/03/2023

## 2023-02-04 DIAGNOSIS — N179 Acute kidney failure, unspecified: Secondary | ICD-10-CM | POA: Diagnosis not present

## 2023-02-04 DIAGNOSIS — M6282 Rhabdomyolysis: Secondary | ICD-10-CM | POA: Insufficient documentation

## 2023-02-04 DIAGNOSIS — J690 Pneumonitis due to inhalation of food and vomit: Secondary | ICD-10-CM | POA: Diagnosis not present

## 2023-02-04 DIAGNOSIS — J9601 Acute respiratory failure with hypoxia: Secondary | ICD-10-CM | POA: Diagnosis not present

## 2023-02-04 DIAGNOSIS — G40901 Epilepsy, unspecified, not intractable, with status epilepticus: Secondary | ICD-10-CM | POA: Diagnosis not present

## 2023-02-04 LAB — GLUCOSE, CAPILLARY
Glucose-Capillary: 103 mg/dL — ABNORMAL HIGH (ref 70–99)
Glucose-Capillary: 110 mg/dL — ABNORMAL HIGH (ref 70–99)
Glucose-Capillary: 111 mg/dL — ABNORMAL HIGH (ref 70–99)
Glucose-Capillary: 125 mg/dL — ABNORMAL HIGH (ref 70–99)
Glucose-Capillary: 92 mg/dL (ref 70–99)
Glucose-Capillary: 95 mg/dL (ref 70–99)

## 2023-02-04 LAB — CBC WITH DIFFERENTIAL/PLATELET
Abs Immature Granulocytes: 0.07 10*3/uL (ref 0.00–0.07)
Basophils Absolute: 0 10*3/uL (ref 0.0–0.1)
Basophils Relative: 0 %
Eosinophils Absolute: 0.1 10*3/uL (ref 0.0–0.5)
Eosinophils Relative: 1 %
HCT: 30.3 % — ABNORMAL LOW (ref 39.0–52.0)
Hemoglobin: 10.7 g/dL — ABNORMAL LOW (ref 13.0–17.0)
Immature Granulocytes: 1 %
Lymphocytes Relative: 16 %
Lymphs Abs: 1.3 10*3/uL (ref 0.7–4.0)
MCH: 29.4 pg (ref 26.0–34.0)
MCHC: 35.3 g/dL (ref 30.0–36.0)
MCV: 83.2 fL (ref 80.0–100.0)
Monocytes Absolute: 1 10*3/uL (ref 0.1–1.0)
Monocytes Relative: 12 %
Neutro Abs: 5.8 10*3/uL (ref 1.7–7.7)
Neutrophils Relative %: 70 %
Platelets: 205 10*3/uL (ref 150–400)
RBC: 3.64 MIL/uL — ABNORMAL LOW (ref 4.22–5.81)
RDW: 12.2 % (ref 11.5–15.5)
WBC: 8.2 10*3/uL (ref 4.0–10.5)
nRBC: 0 % (ref 0.0–0.2)

## 2023-02-04 LAB — BASIC METABOLIC PANEL
Anion gap: 11 (ref 5–15)
BUN: 25 mg/dL — ABNORMAL HIGH (ref 6–20)
CO2: 22 mmol/L (ref 22–32)
Calcium: 8.4 mg/dL — ABNORMAL LOW (ref 8.9–10.3)
Chloride: 105 mmol/L (ref 98–111)
Creatinine, Ser: 1.88 mg/dL — ABNORMAL HIGH (ref 0.61–1.24)
GFR, Estimated: 49 mL/min — ABNORMAL LOW (ref >60)
Glucose, Bld: 104 mg/dL — ABNORMAL HIGH (ref 70–99)
Potassium: 3.6 mmol/L (ref 3.5–5.1)
Sodium: 138 mmol/L (ref 135–145)

## 2023-02-04 LAB — MAGNESIUM: Magnesium: 2.2 mg/dL (ref 1.7–2.4)

## 2023-02-04 LAB — PHOSPHORUS: Phosphorus: 4.1 mg/dL (ref 2.5–4.6)

## 2023-02-04 LAB — CULTURE, BLOOD (ROUTINE X 2): Culture: NO GROWTH

## 2023-02-04 LAB — CK: Total CK: 8718 U/L — ABNORMAL HIGH (ref 49–397)

## 2023-02-04 MED ORDER — AMOXICILLIN-POT CLAVULANATE 875-125 MG PO TABS
1.0000 | ORAL_TABLET | Freq: Two times a day (BID) | ORAL | Status: DC
  Administered 2023-02-05: 1 via ORAL
  Filled 2023-02-04: qty 1

## 2023-02-04 MED ORDER — SIMETHICONE 80 MG PO CHEW
80.0000 mg | CHEWABLE_TABLET | Freq: Four times a day (QID) | ORAL | Status: DC | PRN
  Administered 2023-02-04: 80 mg via ORAL
  Filled 2023-02-04 (×2): qty 1

## 2023-02-04 MED ORDER — SODIUM CHLORIDE 0.9 % IV SOLN
3.0000 g | Freq: Four times a day (QID) | INTRAVENOUS | Status: AC
  Administered 2023-02-04 – 2023-02-05 (×3): 3 g via INTRAVENOUS
  Filled 2023-02-04 (×3): qty 8

## 2023-02-04 NOTE — Assessment & Plan Note (Signed)
Cr 3.3 on admission.  US renal normal.  Cr 1.88 today with fluids. - Continue IV fluids - Avoid nephrotoxins and hypotension

## 2023-02-04 NOTE — Plan of Care (Signed)
  Problem: Safety: Goal: Non-violent Restraint(s) 02/04/2023 2245 by Aretta Nip, RN Outcome: Adequate for Discharge 02/04/2023 2244 by Aretta Nip, RN Outcome: Adequate for Discharge   Problem: Education: Goal: Knowledge of General Education information will improve Description: Including pain rating scale, medication(s)/side effects and non-pharmacologic comfort measures Outcome: Progressing   Problem: Health Behavior/Discharge Planning: Goal: Ability to manage health-related needs will improve Outcome: Progressing   Problem: Clinical Measurements: Goal: Ability to maintain clinical measurements within normal limits will improve Outcome: Progressing Goal: Will remain free from infection Outcome: Progressing Goal: Diagnostic test results will improve Outcome: Progressing Goal: Respiratory complications will improve Outcome: Progressing Goal: Cardiovascular complication will be avoided Outcome: Progressing   Problem: Activity: Goal: Risk for activity intolerance will decrease Outcome: Progressing   Problem: Nutrition: Goal: Adequate nutrition will be maintained Outcome: Progressing   Problem: Coping: Goal: Level of anxiety will decrease Outcome: Progressing   Problem: Elimination: Goal: Will not experience complications related to bowel motility Outcome: Progressing Goal: Will not experience complications related to urinary retention Outcome: Progressing   Problem: Pain Managment: Goal: General experience of comfort will improve Outcome: Progressing   Problem: Safety: Goal: Ability to remain free from injury will improve Outcome: Progressing   Problem: Skin Integrity: Goal: Risk for impaired skin integrity will decrease Outcome: Progressing   Problem: Education: Goal: Expressions of having a comfortable level of knowledge regarding the disease process will increase Outcome: Progressing   Problem: Coping: Goal: Ability to adjust to condition or  change in health will improve Outcome: Progressing Goal: Ability to identify appropriate support needs will improve Outcome: Progressing   Problem: Health Behavior/Discharge Planning: Goal: Compliance with prescribed medication regimen will improve Outcome: Progressing   Problem: Medication: Goal: Risk for medication side effects will decrease Outcome: Progressing   Problem: Clinical Measurements: Goal: Complications related to the disease process, condition or treatment will be avoided or minimized Outcome: Progressing Goal: Diagnostic test results will improve Outcome: Progressing   Problem: Safety: Goal: Verbalization of understanding the information provided will improve Outcome: Progressing   Problem: Self-Concept: Goal: Level of anxiety will decrease Outcome: Progressing Goal: Ability to verbalize feelings about condition will improve Outcome: Progressing

## 2023-02-04 NOTE — Assessment & Plan Note (Signed)
CK up to 7000s today.  Post-seizure. - Continue IV fluids - Trend CK

## 2023-02-04 NOTE — Progress Notes (Signed)
1930 patient alert x4 on RA fluids running as ordered patient able to make all needs known family at bedside  2000 patient up to commode min assist needed 2245 night meds given as ordered

## 2023-02-04 NOTE — Assessment & Plan Note (Signed)
Tracheal aspiration with group C strep only.  RR still elevated, still on O2. - Continue antibiotics, narrow to Unasyn - Flutter and IS

## 2023-02-04 NOTE — Assessment & Plan Note (Signed)
No further seizures. - Continue new dose Lamictal - Consult epileptology, appreciate cares

## 2023-02-04 NOTE — Progress Notes (Signed)
  Progress Note   Patient: Joseph Cameron MVH:[id number] DOB: 1991/01/13 DOA: 01/30/2023     5 DOS: the patient was seen and examined on 02/04/2023 at 9:20AM      Brief hospital course: Mr. Lahaie is a 32 y.o. M with epilepsy who presented with repetitive seizures without return to normal in between.   4/16: In the ER at [facility], intubated, loaded with Keppra and transferred to [facility] for LTM EEG. 4/17: Febrile, CXR showed LLL opacity, started antibiotics 4/19: Extubated 4/20: Transferred [facility]     Assessment and Plan: * Status epilepticus No further seizures. - Continue new dose Lamictal - Consult epileptology, appreciate cares    Aspiration pneumonia Tracheal aspiration with group C strep only.  RR still elevated, still on O2. - Continue antibiotics, narrow to Unasyn - Flutter and IS    Rhabdomyolysis CK up to 7000s today.  Post-seizure. - Continue IV fluids - Trend CK  AKI (acute kidney injury) Cr 3.3 on admission.  US renal normal.  Cr 1.88 today with fluids. - Continue IV fluids - Avoid nephrotoxins and hypotension  Acute respiratory failure with hypoxia Due to aspiration pneumonia in setting of status.  Required intubation.  Improving.          Subjective: Patient has a lot of cramping and gas-like pains in the abdomen.  Had some loose stools yesterday.  No further seizures.  No fever.  Still quite tired overall.     Physical Exam: BP (!) 151/98 (BP Location: Left Arm)   Pulse 69   Temp 98.7 F (37.1 C) (Oral)   Resp (!) 30   Wt 108.3 kg   SpO2 92%   BMI 29.32 kg/m   Adult male, lying in bed, interactive, no acute distress RRR, no murmurs, no peripheral edema Respiratory rate actually seems normal, lung sounds diminished, I do not appreciate rales or wheezing Abdomen with some nonfocal tenderness to palpation, soft, no guarding, no rigidity or rebound Attention normal, affect blunted, appears tired Face symmetric, speech fluent, moves all  extremities with generalized weakness but symmetric strength    Data Reviewed: Discussed with pharmacy Metabolic panel shows creatinine down to 1.8 CK 8700 Hemoglobin 10, no change white count normal Renal ultrasound report reviewed, unremarkable MRI brain, no acute intracranial process      Family Communication: Wife at the bedside    Disposition: Status is: Inpatient Patient continues on supplemental oxygen, we will wean this off, continue IV fluids for his renal failure  Hopefully by tomorrow, will be medically ready for transition to oral regimen and discharge soon        Author: Alberteen Sam, MD 02/04/2023 12:36 PM  For on call review [email or web address].

## 2023-02-04 NOTE — Progress Notes (Signed)
PT Cancellation Note  Patient Details Name: Joseph Cameron MRN: [id number] DOB: 25-Aug-1991   Cancelled Treatment:    Reason Eval/Treat Not Completed: Other (comment)  Pt had just fallen asleep, and pt's mother requested to let him sleep;  Will continue to follow;   Van Clines, PT  Acute Rehabilitation Services Office [phone]    Levi Aland 02/04/2023, 4:05 PM

## 2023-02-04 NOTE — Assessment & Plan Note (Signed)
Due to aspiration pneumonia in setting of status.  Required intubation.  Improving.

## 2023-02-04 NOTE — Hospital Course (Signed)
Mr. Tworek is a 32 y.o. M with epilepsy whoStithted with repetitive seizures without return to normal in between.   4/16: In the ER at [facility], intubated, loaded with Keppra and transferred to [facility] for LTM EEG. 4/17: Febrile, CXR showed LLL opacity, started antibiotics 4/19: Extubated 4/20: Transferred [facility]

## 2023-02-05 ENCOUNTER — Other Ambulatory Visit (HOSPITAL_COMMUNITY): Payer: Self-pay

## 2023-02-05 DIAGNOSIS — G40901 Epilepsy, unspecified, not intractable, with status epilepticus: Secondary | ICD-10-CM | POA: Diagnosis not present

## 2023-02-05 LAB — CBC
HCT: 31.7 % — ABNORMAL LOW (ref 39.0–52.0)
Hemoglobin: 11.1 g/dL — ABNORMAL LOW (ref 13.0–17.0)
MCH: 29.5 pg (ref 26.0–34.0)
MCHC: 35 g/dL (ref 30.0–36.0)
MCV: 84.3 fL (ref 80.0–100.0)
Platelets: 247 10*3/uL (ref 150–400)
RBC: 3.76 MIL/uL — ABNORMAL LOW (ref 4.22–5.81)
RDW: 12.2 % (ref 11.5–15.5)
WBC: 7.5 10*3/uL (ref 4.0–10.5)
nRBC: 0 % (ref 0.0–0.2)

## 2023-02-05 LAB — BASIC METABOLIC PANEL
Anion gap: 13 (ref 5–15)
BUN: 20 mg/dL (ref 6–20)
CO2: 21 mmol/L — ABNORMAL LOW (ref 22–32)
Calcium: 8.3 mg/dL — ABNORMAL LOW (ref 8.9–10.3)
Chloride: 104 mmol/L (ref 98–111)
Creatinine, Ser: 1.5 mg/dL — ABNORMAL HIGH (ref 0.61–1.24)
GFR, Estimated: >60 mL/min (ref >60)
Glucose, Bld: 133 mg/dL — ABNORMAL HIGH (ref 70–99)
Potassium: 3.2 mmol/L — ABNORMAL LOW (ref 3.5–5.1)
Sodium: 138 mmol/L (ref 135–145)

## 2023-02-05 LAB — CULTURE, BLOOD (ROUTINE X 2): Culture: NO GROWTH

## 2023-02-05 LAB — GLUCOSE, CAPILLARY
Glucose-Capillary: 107 mg/dL — ABNORMAL HIGH (ref 70–99)
Glucose-Capillary: 90 mg/dL (ref 70–99)
Glucose-Capillary: 93 mg/dL (ref 70–99)

## 2023-02-05 LAB — CK: Total CK: 4854 U/L — ABNORMAL HIGH (ref 49–397)

## 2023-02-05 LAB — LEGIONELLA PNEUMOPHILA SEROGP 1 UR AG: L. pneumophila Serogp 1 Ur Ag: NEGATIVE

## 2023-02-05 MED ORDER — LAMOTRIGINE 100 MG PO TABS
100.0000 mg | ORAL_TABLET | Freq: Two times a day (BID) | ORAL | 3 refills | Status: DC
  Filled 2023-02-05: qty 60, 30d supply, fill #0

## 2023-02-05 MED ORDER — LAMOTRIGINE 100 MG PO TABS: 100.0000 mg | ORAL_TABLET | Freq: Two times a day (BID) | ORAL | 3 refills | Status: DC

## 2023-02-05 MED ORDER — AMOXICILLIN-POT CLAVULANATE 875-125 MG PO TABS: 1.0000 | ORAL_TABLET | Freq: Two times a day (BID) | ORAL | 0 refills | Status: DC

## 2023-02-05 MED ORDER — VALTOCO 15 MG DOSE 7.5 MG/0.1ML NA LQPK: 7.5000 mg | NASAL | 1 refills | Status: DC | PRN

## 2023-02-05 MED ORDER — POTASSIUM CHLORIDE IN NACL 40-0.9 MEQ/L-% IV SOLN
INTRAVENOUS | Status: DC
  Filled 2023-02-05 (×2): qty 1000

## 2023-02-05 MED ORDER — POTASSIUM CHLORIDE CRYS ER 20 MEQ PO TBCR: 20.0000 meq | EXTENDED_RELEASE_TABLET | Freq: Every day | ORAL | 0 refills | Status: DC

## 2023-02-05 MED ORDER — AMOXICILLIN-POT CLAVULANATE 875-125 MG PO TABS
1.0000 | ORAL_TABLET | Freq: Two times a day (BID) | ORAL | Status: DC
  Filled 2023-02-05: qty 1

## 2023-02-05 MED ORDER — POTASSIUM CHLORIDE CRYS ER 20 MEQ PO TBCR
40.0000 meq | EXTENDED_RELEASE_TABLET | Freq: Two times a day (BID) | ORAL | Status: DC
  Administered 2023-02-05: 40 meq via ORAL
  Filled 2023-02-05: qty 2

## 2023-02-05 MED ORDER — POTASSIUM CHLORIDE CRYS ER 20 MEQ PO TBCR
20.0000 meq | EXTENDED_RELEASE_TABLET | Freq: Every day | ORAL | 0 refills | Status: DC
  Filled 2023-02-05: qty 5, 5d supply, fill #0

## 2023-02-05 MED ORDER — VALTOCO 15 MG DOSE 7.5 MG/0.1ML NA LQPK
7.5000 mg | NASAL | 1 refills | Status: DC | PRN
  Filled 2023-02-05: qty 2, 30d supply, fill #0

## 2023-02-05 MED ORDER — AMOXICILLIN-POT CLAVULANATE 875-125 MG PO TABS
1.0000 | ORAL_TABLET | Freq: Two times a day (BID) | ORAL | 0 refills | Status: DC
  Filled 2023-02-05: qty 5, 3d supply, fill #0

## 2023-02-05 NOTE — Discharge Summary (Signed)
Physician Discharge Summary   Patient: Joseph Cameron MRN: [id number] DOB: 08-Apr-1991  Admit date:     01/30/2023  Discharge date: 02/05/23  Discharge Physician: Joseph Cameron   PCP: Joseph Gunner, MD     Recommendations at discharge:  Follow up with Dr. Janee Cameron PCP in 1 week for pneumonia, status epilepticus, AKI Dr. Janee Cameron: Please check BMP in 1 week (d/c K 3.2, d/c Cr 1.5)  Follow up with Dr. Teresa Cameron Neurology in 4-6 weeks on new dose Lamictal     Discharge Diagnoses: Principal Problem:   Status epilepticus Active Problems:   Aspiration pneumonia   Acute respiratory failure with hypoxia   AKI (acute kidney injury)   Rhabdomyolysis      Hospital Course: [name] is a 32 y.o. M with epilepsy who presented with repetitive seizures without return to normal in between.   4/16: In the ER at [facility], intubated, loaded with Keppra and transferred to [facility] for LTM EEG. 4/17: Febrile, CXR showed LLL opacity, started antibiotics 4/19: Extubated 4/20: Transferred [facility]   * Status epilepticus Admitted and intubated and loaded with Keppra.  Transferred to [facility] for LTM EEG and epileptology consultation.    Seizures ceased and his Lamictal was titrated to 100 BID and Keppra was stopped.  New Lamictal prescription provided at d/c, Neurology follow up planned.    Aspiration pneumonia Devleoped fever hospital day 2.  CXR showed new LLL opacity.  Tracheal aspirate with group C strep only.    Traeted with 5 days antibiotics, discharged to complete 7 days total with Augmentin.   Patient now mentating at baseline, taking orals.  Temp < 100 F, heart rate < 100bpm, RR < 24, SpO2 at baseline.   Stable for discharge.      Rhabdomyolysis CK up post-seizure.  Improved on IV fluids.  Cr improving.    AKI (acute kidney injury) Cr 3.3 on admission.  US renal normal.   Cr improved to 1.5 on day of discharge with IV fluids.  Recommended oral fluids and close PCP follow  up .    Acute respiratory failure with hypoxia Due to aspiration pneumonia in setting of status.  Required intubation.  Resolved.            The [facility] Controlled Substances Registry was reviewed for this patient prior to discharge.  Consultants: Neurology, Critical Care Procedures performed: Intubation Continuous EEG MRI brain Renal ultrasound    Disposition: Home Diet recommendation:  Regular diet  DISCHARGE MEDICATION: Allergies as of 02/05/2023   No Known Allergies      Medication List     STOP taking these medications    lamoTRIgine 50 MG 24 hour tablet Commonly known as: LAMICTAL XR Replaced by: lamoTRIgine 100 MG tablet       TAKE these medications    acetaminophen 325 MG tablet Commonly known as: TYLENOL Take 650 mg by mouth every 6 (six) hours as needed for moderate pain.   amoxicillin-clavulanate 875-125 MG tablet Commonly known as: AUGMENTIN Take 1 tablet by mouth 2 (two) times daily with a meal for 3 days.   lamoTRIgine 100 MG tablet Commonly known as: LAMICTAL Take 1 tablet (100 mg total) by mouth 2 (two) times daily. Replaces: lamoTRIgine 50 MG 24 hour tablet   potassium chloride SA 20 MEQ tablet Commonly known as: KLOR-CON M Take 1 tablet (20 mEq total) by mouth daily for 5 days.   Valtoco 15 MG Dose 2 x 7.5 MG/0.1ML Lqpk Generic drug: diazePAM (  15 MG Dose) Place 7.5 mg into the nose as needed (For seizure lasting more than 2 minutes).   Vitamin D (Ergocalciferol) 1.25 MG (50000 UNIT) Caps capsule Commonly known as: DRISDOL TAKE 1 CAPSULE BY MOUTH EVERY 7 DAYS               Durable Medical Equipment  (From admission, onward)           Start     Ordered   02/03/23 1336  For home use only DME Walker rolling  Once       Question Answer Comment  Walker: With 5 Inch Wheels   Patient needs a walker to treat with the following condition Gait abnormality      02/03/23 1335            Follow-up  Information     Joseph Gunner, MD. Schedule an appointment as soon as possible for a visit in 1 week(s).   Specialty: Family Medicine Contact information: [address] [place] [address] [phone]         Windell Norfolk, MD. Schedule an appointment as soon as possible for a visit in 1 month(s).   Specialty: Neurology Contact information: [address] [place] [address] [phone]                 Discharge Instructions     Discharge instructions   Complete by: As directed    **IMPORTANT DISCHARGE INSTRUCTIONS**   From Dr. Maryfrances Cameron: You were admitted for status epilepticus (repeated seizures without full recovery to normal in between).  Here, you were treated with Keppra and your Lamictal was increased No need to continue Keppra, but your new dose of lamotrigine/Lamictal is 100 mg twice daily  Go see Dr. Teresa Cameron as previously planned but no later than 4-6 weeks   While you were here, you did aspirate and get a pneumonia You were treated with antibiotics and should finish by using the flutter/incentive, and taking maybe a few more days antibiotics Your first dose of antibiotics was 4/18 around noon, so if you take 2 more days Augmentin 875-125 mg twice daily starting tomorrow morning, that would equal 7 days  Take a few more days of potassium supplements in the evening   Your kidney function is getting better Drink plenty of fluids  Go see Dr. Janee Cameron in 1 week and have him check your electrolytes, renal function, and blood counts  For a seizure abortive medicine, use Valtoco spray as directed by Dr. Melynda Cameron   Increase activity slowly   Complete by: As directed        Discharge Exam: Filed Weights   02/01/23 0702 02/02/23 0500 02/03/23 0500  Weight: 106.2 kg 110 kg 108.3 kg    General: Pt is alert, awake, not in acute distress Cardiovascular: RRR, nl S1-S2, no murmurs appreciated.   No LE edema.   Respiratory: Normal  respiratory rate and rhythm.  CTAB without rales or wheezes. Abdominal: Abdomen soft and non-tender.  No distension or HSM.   Neuro/Psych: Strength symmetric in upper and lower extremities.  Judgment and insight appear normal.   Condition at discharge: good  The results of significant diagnostics from this hospitalization (including imaging, microbiology, ancillary and laboratory) are listed below for reference.   Imaging Studies: US RENAL  Result Date: 02/02/2023 CLINICAL DATA:  Acute renal insufficiency. EXAM: RENAL / URINARY TRACT ULTRASOUND COMPLETE COMPARISON:  None Available. FINDINGS: Right Kidney: Renal measurements: 12.7 x 5.4 x 6.7  cm = volume: 239 mL. Moderate increased cortical echogenicity. No mass or hydronephrosis visualized. Left Kidney: Renal measurements: 12.6 x 6.4 x 6.9 cm = volume: 292 mL. Moderate increased cortical echogenicity. No mass or hydronephrosis visualized. Bladder: Contracted with Foley catheter present. Other: None. IMPRESSION: Normal size kidneys with moderate increased cortical echogenicity compatible with medical renal disease. No hydronephrosis. Electronically Signed   By: Elberta Fortis M.D.   On: 02/02/2023 09:23   MR BRAIN WO CONTRAST  Result Date: 02/01/2023 CLINICAL DATA:  Seizure, new-onset, no history of trauma EXAM: MRI HEAD WITHOUT CONTRAST TECHNIQUE: Multiplanar, multiecho pulse sequences of the brain and surrounding structures were obtained without intravenous contrast. COMPARISON:  MRI head July 16, 2008. FINDINGS: Brain: No acute infarction, hemorrhage, hydrocephalus, extra-axial collection or mass lesion. A few punctate foci of susceptibility artifact in the white matter are nonspecific but considered within normal limits for patient age. Hippocampi are symmetric and within normal limits. Vascular: Major arterial flow voids are maintained at the skull base. Skull and upper cervical spine: Normal marrow signal. Sinuses/Orbits: Paranasal sinus  mucosal thickening. No acute orbital findings. Other: Small bilateral mastoid effusions. IMPRESSION: 1. No acute intracranial abnormality. 2. Partially empty sella, which is often a normal anatomic variant but can be associated with idiopathic intracranial hypertension. 3. Paranasal sinus mucosal thickening and small mastoid effusions. Electronically Signed   By: Feliberto Harts M.D.   On: 02/01/2023 18:06   DG CHEST PORT 1 VIEW  Result Date: 02/01/2023 CLINICAL DATA:  Fever EXAM: PORTABLE CHEST 1 VIEW COMPARISON:  01/30/2023 FINDINGS: Alveolar opacity left base consistent with pneumonia similar to the prior study. Mild pulmonary vascular congestion. No pneumothorax. There may be small pleural effusion on the left. Endotracheal tube tip at the thoracic inlet. NG tube tip diaphragm superimposed with stomach. IMPRESSION: Left base consolidation consistent with pneumonia or volume loss and small left-sided pleural effusion. Electronically Signed   By: Layla Maw M.D.   On: 02/01/2023 10:49   Overnight EEG with video  Result Date: 01/31/2023 Charlsie Quest, MD     02/01/2023 10:39 AM Patient Name: CERRONE DEBOLD MRN: [id number] Epilepsy Attending: Charlsie Quest Referring Physician/Provider: Sanjuana Letters, PA-C Duration: 01/30/2023 1605 to 01/31/2023 1605  Patient history: 31yo M with status epilepticus, getting eeg to evaluate for seizure.  Level of alertness:  comatose  AEDs during EEG study: Propofol, Versed, LTG, LEV  Technical aspects: This EEG study was done with scalp electrodes positioned according to the 10-20 International system of electrode placement. Electrical activity was reviewed with band pass filter of 1-70Hz , sensitivity of 7 uV/mm, display speed of 64mm/sec with a  notched filter applied as appropriate. EEG data were recorded continuously and digitally stored.  Video monitoring was available and reviewed as appropriate.  Description: EEG showed burst suppression with burst  of generalized sharply contoured 2-5Hz  theta-delta slowing admixed with 12-13Hz  beta activity lasting 2-3 seconds alternating with generalized suppression lasting 3-4 seconds.  As sedation was weaned, EEG appeared more continuous and showed continuous generalized 3 to 7 Hz theta-delta slowing.  Hyperventilation and photic stimulation were not performed.    ABNORMALITY -Burst suppression, generalized  IMPRESSION: This study is suggestive of profound diffuse encephalopathy likely related to sedation.  As sedation was weaned, EEG improved and was suggestive of moderate to severe diffuse encephalopathy.  No seizures or epileptiform discharges were seen throughout the recording.  Charlsie Quest  DG Chest Port 1 View  Result Date: 01/30/2023 CLINICAL DATA:  Endotracheal intubation.  EXAM: PORTABLE CHEST 1 VIEW COMPARISON:  None Available. FINDINGS: Endotracheal tube with distal tip approximately 5 cm above the carina. Feeding tube coursing below the diaphragm with distal tip not included. The heart size and mediastinal contours are within normal limits. Both lungs are clear. The visualized skeletal structures are unremarkable. IMPRESSION: Endotracheal tube with distal tip approximately 5 cm above the carina. Electronically Signed   By: Larose Hires D.O.   On: 01/30/2023 15:18   CT Head Wo Contrast  Result Date: 01/30/2023 CLINICAL DATA:  Seizures EXAM: CT HEAD WITHOUT CONTRAST TECHNIQUE: Contiguous axial images were obtained from the base of the skull through the vertex without intravenous contrast. RADIATION DOSE REDUCTION: This exam was performed according to the departmental dose-optimization program which includes automated exposure control, adjustment of the mA and/or kV according to patient size and/or use of iterative reconstruction technique. COMPARISON:  07/31/2022 FINDINGS: Brain: No evidence of acute infarction, hemorrhage, mass, mass effect, or midline shift. No hydrocephalus or extra-axial fluid  collection. Vascular: No hyperdense vessel. Skull: Negative for fracture or focal lesion. Sinuses/Orbits: Mild mucosal thickening in the ethmoid air cells. No acute finding in the orbits. Other: The mastoid air cells are well aerated. Gastric and endotracheal tubes noted. IMPRESSION: No acute intracranial process. Electronically Signed   By: Wiliam Ke M.D.   On: 01/30/2023 14:14   EEG adult  Result Date: 01/30/2023 Charlsie Quest, MD     01/30/2023  3:58 PM Patient Name: DELQUAN POUCHER MRN: [id number] Epilepsy Attending: Charlsie Quest Referring Physician/Provider: Loetta Rough, MD Date: 01/30/2023 Duration: 22.09 mins Patient history: 32yo M with status epilepticus, getting eeg to evaluate for seizure. Level of alertness:  comatose AEDs during EEG study: Propofol, Versed Technical aspects: This EEG study was done with scalp electrodes positioned according to the 10-20 International system of electrode placement. Electrical activity was reviewed with band pass filter of 1-70Hz , sensitivity of 7 uV/mm, display speed of 70mm/sec with a 60Hz  notched filter applied as appropriate. EEG data were recorded continuously and digitally stored.  Video monitoring was available and reviewed as appropriate. Description: EEG showed burst suppression with burst of generalized sharply contoured 12-13Hz  beta activity lasting 2-3 seconds alternating with generalized suppression lasting 3-4 seconds.  Hyperventilation and photic stimulation were not performed.   ABNORMALITY -Burst suppression, generalized IMPRESSION: This study is suggestive of profound diffuse encephalopathy likely related to sedation. No seizures or epileptiform discharges were seen throughout the recording. Charlsie Quest   DG Chest Portable 1 View  Result Date: 01/30/2023 CLINICAL DATA:  Intubation EXAM: PORTABLE CHEST 1 VIEW, portable abdominal x-ray for tube placement COMPARISON:  Chest x-ray 10/17/2022 FINDINGS: New ET tube seen with tip  proximally 5 cm above the carina. Enteric tube in place with tip overlying the fundus of the stomach. Subtle opacity left lung base. Infiltrate is possible. No pneumothorax, effusion or edema. Normal cardiopericardial silhouette. Overlapping cardiac leads. Gas seen along nondilated loops of bowel in the visualized upper abdomen elsewhere. IMPRESSION: ET tube and enteric tube in place. Subtle left lung base opacity.  Recommend follow-up Electronically Signed   By: Karen Kays M.D.   On: 01/30/2023 11:09   DG Abdomen 1 View  Result Date: 01/30/2023 CLINICAL DATA:  Intubation EXAM: PORTABLE CHEST 1 VIEW, portable abdominal x-ray for tube placement COMPARISON:  Chest x-ray 10/17/2022 FINDINGS: New ET tube seen with tip proximally 5 cm above the carina. Enteric tube in place with tip overlying the fundus of the stomach. Subtle  opacity left lung base. Infiltrate is possible. No pneumothorax, effusion or edema. Normal cardiopericardial silhouette. Overlapping cardiac leads. Gas seen along nondilated loops of bowel in the visualized upper abdomen elsewhere. IMPRESSION: ET tube and enteric tube in place. Subtle left lung base opacity.  Recommend follow-up Electronically Signed   By: Karen Kays M.D.   On: 01/30/2023 11:09    Microbiology: Results for orders placed or performed during the hospital encounter of 01/30/23  MRSA Next Gen by PCR, Nasal     Status: None   Collection Time: 01/30/23  2:44 PM   Specimen: Nasal Mucosa; Nasal Swab  Result Value Ref Range Status   MRSA by PCR Next Gen NOT DETECTED NOT DETECTED Final    Comment: (NOTE) The GeneXpert MRSA Assay (FDA approved for NASAL specimens only), is one component of a comprehensive MRSA colonization surveillance program. It is not intended to diagnose MRSA infection nor to guide or monitor treatment for MRSA infections. Test performance is not FDA approved in patients less than 75 years old. Performed at [facility], [address]  [place], [place] [address]   Culture, Respiratory w Gram Stain     Status: None   Collection Time: 02/01/23  9:22 AM   Specimen: Tracheal Aspirate; Respiratory  Result Value Ref Range Status   Specimen Description TRACHEAL ASPIRATE  Final   Special Requests NONE  Final   Gram Stain NO WBC SEEN MODERATE GRAM NEGATIVE RODS   Final   Culture   Final    ABUNDANT STREPTOCOCCUS GROUP C Beta hemolytic streptococci are predictably susceptible to penicillin and other beta lactams. Susceptibility testing not routinely performed. WITHIN NORMAL RESPIRATORY FLORA Performed at [facility], [address], [place], [place] [address]    Report Status 02/03/2023 FINAL  Final  Culture, blood (Routine X 2) w Reflex to ID Panel     Status: None (Preliminary result)   Collection Time: 02/01/23  9:48 AM   Specimen: BLOOD LEFT HAND  Result Value Ref Range Status   Specimen Description BLOOD LEFT HAND  Final   Special Requests   Final    BOTTLES DRAWN AEROBIC AND ANAEROBIC Blood Culture adequate volume   Culture   Final    NO GROWTH 4 DAYS Performed at [facility], [address], [place], [place] [address]    Report Status PENDING  Incomplete  Culture, blood (Routine X 2) w Reflex to ID Panel     Status: None (Preliminary result)   Collection Time: 02/01/23  9:49 AM   Specimen: BLOOD LEFT HAND  Result Value Ref Range Status   Specimen Description BLOOD LEFT HAND  Final   Special Requests   Final    BOTTLES DRAWN AEROBIC AND ANAEROBIC Blood Culture adequate volume   Culture   Final    NO GROWTH 4 DAYS Performed at [facility], [address] [place], [place] [address]    Report Status PENDING  Incomplete    Labs: CBC: Recent Labs  Lab 01/30/23 0310 01/30/23 0959 01/30/23 1502 02/01/23 0643 02/02/23 0438 02/03/23 0051 02/04/23 0040 02/05/23 0225  WBC 10.1 20.5*   < > 8.5 8.3 8.4 8.2 7.5  NEUTROABS 7.1 15.4*  --   --   --   --  5.8  --   HGB 15.2 16.8   < > 13.8 11.7*  11.2* 10.7* 11.1*  HCT 44.7 53.1*   < > 39.5 35.1* 32.3* 30.3* 31.7*  MCV 85.5  91.6   < > 84.0 86.0 85.0 83.2 84.3  PLT 212 251   < > 188 171 179 205 247   < > = values in this interval not displayed.   Basic Metabolic Panel: Recent Labs  Lab 01/31/23 0909 01/31/23 1756 02/01/23 0643 02/01/23 1929 02/02/23 0438 02/03/23 0051 02/04/23 0040 02/05/23 0225  NA  --  136 140  --  140 136 138 138  K  --  3.5 3.9  --  4.3 4.0 3.6 3.2*  CL  --  108 110  --  109 106 105 104  CO2  --  16* 19*  --  21* 20* 22 21*  GLUCOSE  --  131* 107*  --  172* 114* 104* 133*  BUN  --  19 22*  --  39* 35* 25* 20  CREATININE  --  2.56* 2.51*  --  3.32* 2.56* 1.88* 1.50*  CALCIUM  --  8.5* 8.2*  --  8.0* 8.1* 8.4* 8.3*  MG  --  1.9 2.0 1.7 1.7  --  2.2  --   PHOS 1.7* 3.4 4.1 4.9*  --   --  4.1  --    Liver Function Tests: Recent Labs  Lab 01/30/23 1047  AST 52*  ALT 31  ALKPHOS 54  BILITOT 0.9  PROT 8.7*  ALBUMIN 5.0   CBG: Recent Labs  Lab 02/04/23 1953 02/04/23 2347 02/05/23 0355 02/05/23 0750 02/05/23 1142  GLUCAP 125* 103* 107* 90 93    Discharge time spent: approximately 35 minutes spent on discharge counseling, evaluation of patient on day of discharge, and coordination of discharge planning with nursing, social work, pharmacy and case management  Signed: Alberteen Sam, MD [facility] 02/05/2023

## 2023-02-05 NOTE — Discharge Instructions (Signed)
Seizure precautions: Per [place] DMV statutes, patients with seizures are not allowed to drive until they have been seizure-free for six months and cleared by a physician    Use caution when using heavy equipment or power tools. Avoid working on ladders or at heights. Take showers instead of baths. Ensure the water temperature is not too high on the home water heater. Do not go swimming alone. Do not lock yourself in a room alone (i.e. bathroom). When caring for infants or small children, sit down when holding, feeding, or changing them to minimize risk of injury to the child in the event you have a seizure. Maintain good sleep hygiene. Avoid alcohol.    If patient has another seizure, call 911 and bring them back to the ED if: A.  The seizure lasts longer than 5 minutes.      B.  The patient doesn't wake shortly after the seizure or has new problems such as difficulty seeing, speaking or moving following the seizure C.  The patient was injured during the seizure D.  The patient has a temperature over 102 F (39C) E.  The patient vomited during the seizure and now is having trouble breathing    During the Seizure   - First, ensure adequate ventilation and place patients on the floor on their left side  Loosen clothing around the neck and ensure the airway is patent. If the patient is clenching the teeth, do not force the mouth open with any object as this can cause severe damage - Remove all items from the surrounding that can be hazardous. The patient may be oblivious to what's happening and may not even know what he or she is doing. If the patient is confused and wandering, either gently guide him/her away and block access to outside areas - Reassure the individual and be comforting - Call 911. In most cases, the seizure ends before EMS arrives. However, there are cases when seizures may last over 3 to 5 minutes. Or the individual may have developed breathing difficulties or severe  injuries. If a pregnant patient or a person with diabetes develops a seizure, it is prudent to call an ambulance. - Finally, if the patient does not regain full consciousness, then call EMS. Most patients will remain confused for about 45 to 90 minutes after a seizure, so you must use judgment in calling for help.      After the Seizure (Postictal Stage)   After a seizure, most patients experience confusion, fatigue, muscle pain and/or a headache. Thus, one should permit the individual to sleep. For the next few days, reassurance is essential. Being calm and helping reorient the person is also of importance.   Most seizures are painless and end spontaneously. Seizures are not harmful to others but can lead to complications such as stress on the lungs, brain and the heart. Individuals with prior lung problems may develop labored breathing and respiratory distress.  

## 2023-02-05 NOTE — Progress Notes (Signed)
Physical Therapy Treatment Patient Details Name: Joseph Cameron MRN: [id number] DOB: [birth year] Today's Date: 02/05/2023   History of Present Illness Pt is 32 yo male admitted 01/30/23 with breakthrough seizure and was intubated for airway protection.  ETT 01/30/23-02/02/23.  Pt with hx of seizures.    PT Comments    Pt progressing well with mobility, tolerating good hallway distance gait with use of single limb UE support. Pt requires safety cues throughout mobility, demonstrates slight LE weakness and unsteadiness so will benefit from continued acute PT. Pt's fiance present during session, supportive throughout.  Will continue to follow.     Recommendations for follow up therapy are one component of a multi-disciplinary discharge planning process, led by the attending physician.  Recommendations may be updated based on patient status, additional functional criteria and insurance authorization.  Follow Up Recommendations       Assistance Recommended at Discharge Set up Supervision/Assistance  Patient can return home with the following A little help with walking and/or transfers;A little help with bathing/dressing/bathroom;Assistance with cooking/housework;Help with stairs or ramp for entrance   Equipment Recommendations  Rolling walker (2 wheels)    Recommendations for Other Services       Precautions / Restrictions Precautions Precautions: Fall Restrictions Weight Bearing Restrictions: No     Mobility  Bed Mobility Overal bed mobility: Needs Assistance Bed Mobility: Supine to Sit     Supine to sit: HOB elevated, Supervision     General bed mobility comments: for safety, use of bedrails and increased time    Transfers Overall transfer level: Needs assistance Equipment used: 1 person hand held assist Transfers: Sit to/from Stand Sit to Stand: Min assist           General transfer comment: light steadying assist, pt reaching for environment to self-steady  once standing    Ambulation/Gait Ambulation/Gait assistance: Min assist Gait Distance (Feet): 350 Feet Assistive device: IV Pole Gait Pattern/deviations: Step-through pattern, Decreased stride length, Drifts right/left, Trunk flexed Gait velocity: wfl, but unsteady with faster speed     General Gait Details: cues for upright posture and slowign down, min steadying assist   Stairs             Wheelchair Mobility    Modified Rankin (Stroke Patients Only)       Balance Overall balance assessment: Needs assistance Sitting-balance support: No upper extremity supported Sitting balance-Leahy Scale: Good     Standing balance support: No upper extremity supported Standing balance-Leahy Scale: Fair                              Cognition Arousal/Alertness: Awake/alert Behavior During Therapy: WFL for tasks assessed/performed Overall Cognitive Status: Within Functional Limits for tasks assessed                                 General Comments: flat affect, unsure of pt baseline        Exercises      General Comments General comments (skin integrity, edema, etc.): vss on RA      Pertinent Vitals/Pain Pain Assessment Pain Assessment: No/denies pain    Home Living                          Prior Function            PT Goals (current goals can  now be found in the care plan section) Acute Rehab PT Goals Patient Stated Goal: return home PT Goal Formulation: With patient/family Time For Goal Achievement: 02/17/23 Potential to Achieve Goals: Good Progress towards PT goals: Progressing toward goals    Frequency    Min 4X/week      PT Plan Current plan remains appropriate    Co-evaluation              AM-PAC PT "6 Clicks" Mobility   Outcome Measure  Help needed turning from your back to your side while in a flat bed without using bedrails?: A Little Help needed moving from lying on your back to sitting on  the side of a flat bed without using bedrails?: A Little Help needed moving to and from a bed to a chair (including a wheelchair)?: A Little Help needed standing up from a chair using your arms (e.g., wheelchair or bedside chair)?: A Little Help needed to walk in hospital room?: A Little Help needed climbing 3-5 steps with a railing? : A Little 6 Click Score: 18    End of Session   Activity Tolerance: Patient tolerated treatment well Patient left: with chair alarm set;in chair;with call bell/phone within reach;with family/visitor present Nurse Communication: Mobility status PT Visit Diagnosis: Other abnormalities of gait and mobility (R26.89);Unsteadiness on feet (R26.81)     Time: 1610-9604 PT Time Calculation (min) (ACUTE ONLY): 15 min  Charges:  $Gait Training: 8-22 mins                     Marye Round, PT DPT Acute Rehabilitation Services Secure Chat Preferred  Office [phone]    Kaizen Ibsen Sheliah Plane 02/05/2023, 11:57 AM

## 2023-02-05 NOTE — Progress Notes (Signed)
Patient discharged at 1445. Both IV's removed. AVS reviewed and a copy given to patient. All questions addressed. Patient being discharged via wheelchair by Denny Peon, CNA and accompanied by family.

## 2023-02-05 NOTE — Progress Notes (Signed)
Nutrition Follow-up  DOCUMENTATION CODES:  Not applicable  INTERVENTION:  Continue regular diet  NUTRITION DIAGNOSIS:  Inadequate oral intake related to inability to eat as evidenced by NPO status. - remains applicable  GOAL:  Patient will meet greater than or equal to 90% of their needs - progressing, regular diet in place  MONITOR:  PO intake, Labs, Weight trends  REASON FOR ASSESSMENT:  Ventilator, Consult Enteral/tube feeding initiation and management, Assessment of nutrition requirement/status  ASSESSMENT:   32 year old male who presented to the ED on 4/16 with seizures. PMH of ADHD, seizure disorder. Pt admitted with status epilepticus and required intubation.  4/16 - admitted, intubated 4/19 - extubated  Pt working with therapy at the time of assessment. Discussed in rounds. Pt no longer requiring ICU level care, pending transfer out of unit when bed available. RN reports medically stable for dc, likely will do home this afternoon.     Good intake of meals so far. No concerns with nutrition status at this time.   Intake/Output Summary (Last 24 hours) at 02/05/2023 0940 Last data filed at 02/05/2023 0900 Gross per 24 hour  Intake 4523.27 ml  Output 2550 ml  Net 1973.27 ml  Net IO Since Admission: 7,288.07 mL [02/05/23 0940]  Average Meal Intake: 4/21: 100% intake x 1 recorded meals  Nutritionally Relevant Medications: Scheduled Meds:  amoxicillin-clavulanate  1 tablet Oral Q12H   polyethylene glycol  17 g Oral BID   potassium chloride  40 mEq Oral BID   Continuous Infusions:  0.9 % NaCl with KCl 40 mEq / L 100 mL/hr at 02/05/23 0900   PRN Meds: simethicone  Labs Reviewed: K 3.2 Creatinine 1.5 CBG ranges from 90-125 mg/dL over the last 24 hours   NUTRITION - FOCUSED PHYSICAL EXAM: Flowsheet Row Most Recent Value  Orbital Region No depletion  Upper Arm Region No depletion  Thoracic and Lumbar Region No depletion  Buccal Region Unable to assess   Temple Region No depletion  Clavicle Bone Region No depletion  Clavicle and Acromion Bone Region No depletion  Scapular Bone Region Unable to assess  Dorsal Hand No depletion  Patellar Region No depletion  Anterior Thigh Region No depletion  Posterior Calf Region No depletion  Edema (RD Assessment) None  Hair Reviewed  Eyes Unable to assess  Mouth Reviewed  Skin Reviewed  Nails Reviewed    Diet Order:   Diet Order             Diet regular Room service appropriate? Yes; Fluid consistency: Thin  Diet effective now                   EDUCATION NEEDS:  Education needs have been addressed  Skin:  Skin Assessment: Reviewed RN Assessment  Last BM:  4/22 - type 7  Height:  Ht Readings from Last 1 Encounters:  11/03/22 6' 3.67" (1.922 m)    Weight:  Wt Readings from Last 1 Encounters:  02/03/23 108.3 kg    Ideal Body Weight:  91.8 kg  BMI:  Body mass index is 29.32 kg/m.  Estimated Nutritional Needs:  Kcal:  2400-2600 Protein:  120-140 grams Fluid:  >2.2 L    Greig Castilla, RD, LDN Clinical Dietitian RD pager # available in [facility]  After hours/weekend pager # available in [facility]

## 2023-02-06 ENCOUNTER — Telehealth: Payer: Self-pay

## 2023-02-06 LAB — CULTURE, BLOOD (ROUTINE X 2): Special Requests: ADEQUATE

## 2023-02-06 NOTE — Transitions of Care (Post Inpatient/ED Visit) (Signed)
   02/06/2023  Name: Joseph Cameron MRN: [id number] DOB: 1991/04/29  Today's TOC FU Call Status: Today's TOC FU Call Status:: Successful TOC FU Call Competed TOC FU Call Complete Date: 02/06/23  Transition Care Management Follow-up Telephone Call Date of Discharge: 02/05/23 Discharge Facility: [facility]) Type of Discharge: Inpatient Admission Primary Inpatient Discharge Diagnosis:: Status Epilepticus How have you been since you were released from the hospital?: Better Any questions or concerns?: No  Items Reviewed: Did you receive and understand the discharge instructions provided?: Yes Medications obtained and verified?: Yes (Medications Reviewed) Any new allergies since your discharge?: No Dietary orders reviewed?: NA Do you have support at home?: Yes People in Home: significant other  Home Care and Equipment/Supplies: Were Home Health Services Ordered?: No Any new equipment or medical supplies ordered?: No  Functional Questionnaire: Do you need assistance with bathing/showering or dressing?: No Do you need assistance with meal preparation?: No Do you need assistance with eating?: No Do you have difficulty maintaining continence: No Do you need assistance with getting out of bed/getting out of a chair/moving?: No Do you have difficulty managing or taking your medications?: No  Follow up appointments reviewed: PCP Follow-up appointment confirmed?: Yes Date of PCP follow-up appointment?: 02/09/23 Follow-up Provider: Dr. Janee Morn Specialist [facility] Follow-up appointment confirmed?: No Reason Specialist Follow-Up Not Confirmed: Patient has Specialist Provider Number and will Call for Appointment Do you need transportation to your follow-up appointment?: No Do you understand care options if your condition(s) worsen?: Yes-patient verbalized understanding    SIGNATURE Kandis Fantasia, LPN [facility] Health Advisor [place] [facility] You Are. We  Are. [employer] # [phone]

## 2023-02-08 ENCOUNTER — Ambulatory Visit (INDEPENDENT_AMBULATORY_CARE_PROVIDER_SITE_OTHER): Payer: 59 | Admitting: Neurology

## 2023-02-08 VITALS — BP 139/79 | HR 61 | Ht 75.0 in | Wt 221.5 lb

## 2023-02-08 DIAGNOSIS — R0683 Snoring: Secondary | ICD-10-CM | POA: Diagnosis not present

## 2023-02-08 DIAGNOSIS — Z5181 Encounter for therapeutic drug level monitoring: Secondary | ICD-10-CM | POA: Diagnosis not present

## 2023-02-08 DIAGNOSIS — G40919 Epilepsy, unspecified, intractable, without status epilepticus: Secondary | ICD-10-CM | POA: Diagnosis not present

## 2023-02-08 DIAGNOSIS — G40009 Localization-related (focal) (partial) idiopathic epilepsy and epileptic syndromes with seizures of localized onset, not intractable, without status epilepticus: Secondary | ICD-10-CM

## 2023-02-08 DIAGNOSIS — R0681 Apnea, not elsewhere classified: Secondary | ICD-10-CM

## 2023-02-08 MED ORDER — LAMOTRIGINE ER 300 MG PO TB24: 300.0000 mg | ORAL_TABLET | Freq: Every day | ORAL | 11 refills | Status: DC

## 2023-02-08 MED ORDER — VALTOCO 15 MG DOSE 7.5 MG/0.1ML NA LQPK: 7.5000 mg | NASAL | 5 refills | Status: DC | PRN

## 2023-02-08 NOTE — Patient Instructions (Addendum)
Increase lamotrigine to XR 300 mg daily Will check a lamotrigine level today and BMP Valtoco as rescue medication Referral to sleep neurology for evaluation of sleep apnea Discussed driving restriction for the next 6 months  Follow-up in 3 months or sooner if worse

## 2023-02-08 NOTE — Progress Notes (Signed)
[facility]  PATIENT: Joseph Cameron DOB: Jun 17, 1991  REQUESTING CLINICIAN: Garnette Gunner, MD HISTORY FROM: Patient  REASON FOR VISIT: Epilepsy    HISTORICAL  CHIEF COMPLAINT:  Chief Complaint  Patient presents with   Hospitalization Follow-up    Rm 12, wife julie present  Better since hospital visit but not good sleeping 1-2 hours at a time.  Body heat excessive, urinating every hour to 1.5 hours. 2 days ago epistaxis    INTERVAL HISTORY 02/08/2023:  Patient presents today for follow-up, he is accompanied by wife.  Last visit was in January, at that time he did not have any seizures so plan was to continue with lamotrigine XR 150 mg daily.  Unfortunately on April 16 he did have a breakthrough seizure.  He reported compliance with his medication but there was possible sleep deprivation.  He had a cluster of seizures, was intubated and put on burst suppression.  He was also loaded on Keppra.  On discharge his lamotrigine was increased to 100 mg twice daily. He went from XR formulation to a immediate release and since then has been having complaint of hot sweats, difficulty with sleep, and restlessness.  She does also complain of some headaches.  Reported he is feeling better day by day. Again he could not tell me any provoking factor except then being under a lot of stress stress and sleep deprivation. Another complaint brought on by wife wife is loud snoring and apneic period and gasping for air during sleep. He feels sometime in the morning when he wakes up he does not feel rested, he does not report sleeping while driving but stated that he will fall asleep while reading.  He would like to be evaluated for sleep apnea    INTERVAL HISTORY 10/26/2022:  Patient presents today for follow-up, he is accompanied by her mother.  Since last visit in October, he has not had any additional seizures.  He is compliant with the lamotrigine XR 150 mg daily, denies any side  effect and no other complaints.  He reported in the last week of December he was experiencing chest pain.  He presented to the ED all his workup has been negative.   HISTORY OF PRESENT ILLNESS:  This is a 32 year old gentleman past medical history of ADHD and epilepsy who is presenting to establish care.  Patient reported his first seizure happened at the age of 45, it was a nocturnal seizure.  At that time, he was not put on anti-seizure medication.  Then 2 years later he had another seizure, again nocturnal and was put on lamotrigine.  He was doing well on lamotrigine 150 mg extended release nightly, no seizure for the next 6 years.  He self discontinued the medication, started having episodes of severe nausea for about 30 seconds and actually ended up having a breakthrough seizure.  He restarted the medication and was doing well for next 5 years, then he self discontinued the medication and then 2 years later on July 31 2022 he had another generalized seizure while at work. He reports having a feeling of severe nausea then woke up on the floor. He was taken to the ED, initial work up unrevealing, his CT head was within normal limits.  With the seizures, he had tongue biting, fall and bruises.  Denies any other type of seizures, denies myoclonic jerks, and denies episode of staring spells.   Handedness: Right handed   Onset: At the age of 32  Seizure  Type: Generalized   Current frequency: A total of 5, last one 07/31/2022  Any injuries from seizures: Tongue bite, bruises,   Seizure risk factors: Aunt, uncle from father side, history of head injury   Previous ASMs: Lamotrigine   Currenty ASMs: Lamotrigine 100 mg twice daily   ASMs side effects: None   Brain Images: Normal head CT   Previous EEGs: unavailable for review    OTHER MEDICAL CONDITIONS: Epilepsy   REVIEW OF SYSTEMS: Full 14 system review of systems performed and negative with exception of: As noted in the HPI    ALLERGIES: No Known Allergies  HOME MEDICATIONS: Outpatient Medications Prior to Visit  Medication Sig Dispense Refill   acetaminophen (TYLENOL) 325 MG tablet Take 650 mg by mouth every 6 (six) hours as needed for moderate pain.     potassium chloride SA (KLOR-CON M) 20 MEQ tablet Take 1 tablet (20 mEq total) by mouth daily for 5 days. 5 tablet 0   diazePAM, 15 MG Dose, (VALTOCO 15 MG DOSE) 2 x 7.5 MG/0.1ML LQPK Place 7.5 mg into the nose as needed (For seizure lasting more than 2 minutes). 2 each 1   lamoTRIgine (LAMICTAL) 100 MG tablet Take 1 tablet (100 mg total) by mouth 2 (two) times daily. 60 tablet 3   amoxicillin-clavulanate (AUGMENTIN) 875-125 MG tablet Take 1 tablet by mouth 2 (two) times daily with a meal for 3 days. 5 tablet 0   Vitamin D, Ergocalciferol, (DRISDOL) 1.25 MG (50000 UNIT) CAPS capsule TAKE 1 CAPSULE BY MOUTH EVERY 7 DAYS (Patient not taking: Reported on 02/08/2023) 5 capsule 0   No facility-administered medications prior to visit.    PAST MEDICAL HISTORY: Past Medical History:  Diagnosis Date   Acute tonsillitis 04/07/2013   Seizures (HCC)    SYNCOPE, VASOVAGAL 07/05/2009   Qualifier: Diagnosis of   By: Amador Cunas  MD, Janett Labella       PAST SURGICAL HISTORY: Past Surgical History:  Procedure Laterality Date   TONSILLECTOMY      FAMILY HISTORY: Family History  Problem Relation Age of Onset   Hyperlipidemia Mother    Diabetes Mother    Hypertension Father    Seizures Maternal Aunt    Diabetes Maternal Grandmother    Leukemia Maternal Grandmother    Diabetes Maternal Grandfather    Hypertension Paternal Grandmother    Hypertension Paternal Grandfather     SOCIAL HISTORY: Social History   Socioeconomic History   Marital status: Married    Spouse name: Not on file   Number of children: Not on file   Years of education: Not on file   Highest education level: Doctorate  Occupational History   Not on file  Tobacco Use   Smoking status:  Former    Types: Cigarettes   Smokeless tobacco: Current  Vaping Use   Vaping Use: Every day  Substance and Sexual Activity   Alcohol use: Not Currently   Drug use: No   Sexual activity: Yes  Other Topics Concern   Not on file  Social History Narrative   Not on file   Social Determinants of Health   Financial Resource Strain: Low Risk  (02/08/2023)   Overall Financial Resource Strain (CARDIA)    Difficulty of Paying Living Expenses: Not hard at all  Food Insecurity: No Food Insecurity (02/08/2023)   Hunger Vital Sign    Worried About Running Out of Food in the Last Year: Never true    Ran Out of Food in the Last  Year: Never true  Transportation Needs: No Transportation Needs (02/08/2023)   PRAPARE - Administrator, Civil Service (Medical): No    Lack of Transportation (Non-Medical): No  Physical Activity: Unknown (02/08/2023)   Exercise Vital Sign    Days of Exercise per Week: Patient declined    Minutes of Exercise per Session: Not on file  Stress: Stress Concern Present (02/08/2023)   [employer] of Occupational Health - Occupational Stress Questionnaire    Feeling of Stress : To some extent  Social Connections: Moderately Isolated (02/08/2023)   Social Connection and Isolation Panel [NHANES]    Frequency of Communication with Friends and Family: More than three times a week    Frequency of Social Gatherings with Friends and Family: More than three times a week    Attends Religious Services: Never    Database administrator or Organizations: No    Attends Engineer, structural: Not on file    Marital Status: Married  Catering manager Violence: Not on file    PHYSICAL EXAM  GENERAL EXAM/CONSTITUTIONAL: Vitals:  Vitals:   02/08/23 1425  BP: 139/79  Pulse: 61  Weight: 221 lb 8 oz (100.5 kg)  Height: 6\' 3"  (1.905 m)   Body mass index is 27.69 kg/m. Wt Readings from Last 3 Encounters:  02/08/23 221 lb 8 oz (100.5 kg)  02/03/23 238 lb 12.1 oz  (108.3 kg)  11/03/22 228 lb (103.4 kg)   Patient is in no distress; well developed, nourished and groomed; neck is supple  MUSCULOSKELETAL: Gait, strength, tone, movements noted in Neurologic exam below  NEUROLOGIC: MENTAL STATUS:      No data to display         awake, alert, oriented to person, place and time recent and remote memory intact normal attention and concentration language fluent, comprehension intact, naming intact fund of knowledge appropriate  CRANIAL NERVE:  2nd, 3rd, 4th, 6th - visual fields full to confrontation, extraocular muscles intact, no nystagmus 5th - facial sensation symmetric 7th - facial strength symmetric 8th - hearing intact 9th - palate elevates symmetrically, uvula midline 11th - shoulder shrug symmetric 12th - tongue protrusion midline  MOTOR:  normal bulk and tone, full strength in the BUE, BLE  SENSORY:  normal and symmetric to light touch  COORDINATION:  finger-nose-finger, fine finger movements normal   GAIT/STATION:  normal   DIAGNOSTIC DATA (LABS, IMAGING, TESTING) - I reviewed patient records, labs, notes, testing and imaging myself where available.  Lab Results  Component Value Date   WBC 9.3 02/08/2023   HGB 14.3 02/08/2023   HCT 42.7 02/08/2023   MCV 85 02/08/2023   PLT 447 02/08/2023      Component Value Date/Time   NA 141 02/08/2023 1503   K 4.4 02/08/2023 1503   CL 99 02/08/2023 1503   CO2 26 02/08/2023 1503   GLUCOSE 100 (H) 02/08/2023 1503   GLUCOSE 133 (H) 02/05/2023 0225   BUN 11 02/08/2023 1503   CREATININE 1.15 02/08/2023 1503   CALCIUM 9.5 02/08/2023 1503   PROT 8.7 (H) 01/30/2023 1047   ALBUMIN 5.0 01/30/2023 1047   AST 52 (H) 01/30/2023 1047   ALT 31 01/30/2023 1047   ALKPHOS 54 01/30/2023 1047   BILITOT 0.9 01/30/2023 1047   GFRNONAA >60 02/05/2023 0225   GFRAA  02/07/2010 0932    NOT CALCULATED        The eGFR has been calculated using the MDRD equation. This calculation has not  been validated in all clinical situations. eGFR's persistently <60 mL/min signify possible Chronic Kidney Disease.   Lab Results  Component Value Date   CHOL 189 11/06/2022   HDL 47.20 11/06/2022   LDLCALC 110 (H) 11/06/2022   TRIG 269 (H) 01/31/2023   Lab Results  Component Value Date   HGBA1C 5.2 11/06/2022   No results found for: "VITAMINB12" Lab Results  Component Value Date   TSH 3.10 11/06/2022    Head CT 07/31/22 Normal head CT   Routine EEG 08/22/2022 Normal    I personally reviewed brain Images.  ASSESSMENT AND PLAN  32 y.o. year old male  with history of epilepsy who is presenting for follow up after hospitalization for breakthrough seizures. No provoking factors identified except possible sleep deprivation and stress.  At discharge he was put on Lamotrigine immediate release 100 mg twice daily but reports side effect of restlessness, headaches, heat sweats.  Will switch him back to lamotrigine XR but increase the dose to 300 mg daily.  I will also check a level today and sodium.  I will also give him rescue medication, Valtoco.  I will see him in 3 months for follow-up or sooner if worse    1. Partial idiopathic epilepsy with seizures of localized onset, not intractable, without status epilepticus (HCC)   2. Therapeutic drug monitoring   3. Breakthrough seizure (HCC)       Patient Instructions  Increase lamotrigine to XR 300 mg daily Will check a lamotrigine level today and BMP Valtoco as rescue medication Referral to sleep neurology for evaluation of sleep apnea Discussed driving restriction for the next 6 months  Follow-up in 3 months or sooner if worse   Per [facility] statutes, patients with seizures are not allowed to drive until they have been seizure-free for six months.  Other recommendations include using caution when using heavy equipment or power tools. Avoid working on ladders or at heights. Take showers instead of baths.  Do not  swim alone.  Ensure the water temperature is not too high on the home water heater. Do not go swimming alone. Do not lock yourself in a room alone (i.e. bathroom). When caring for infants or small children, sit down when holding, feeding, or changing them to minimize risk of injury to the child in the event you have a seizure. Maintain good sleep hygiene. Avoid alcohol.  Also recommend adequate sleep, hydration, good diet and minimize stress.   During the Seizure  - First, ensure adequate ventilation and place patients on the floor on their left side  Loosen clothing around the neck and ensure the airway is patent. If the patient is clenching the teeth, do not force the mouth open with any object as this can cause severe damage - Remove all items from the surrounding that can be hazardous. The patient may be oblivious to what's happening and may not even know what he or she is doing. If the patient is confused and wandering, either gently guide him/her away and block access to outside areas - Reassure the individual and be comforting - Call 911. In most cases, the seizure ends before EMS arrives. However, there are cases when seizures may last over 3 to 5 minutes. Or the individual may have developed breathing difficulties or severe injuries. If a pregnant patient or a person with diabetes develops a seizure, it is prudent to call an ambulance. - Finally, if the patient does not regain full consciousness, then call EMS. Most  patients will remain confused for about 45 to 90 minutes after a seizure, so you must use judgment in calling for help. - Avoid restraints but make sure the patient is in a bed with padded side rails - Place the individual in a lateral position with the neck slightly flexed; this will help the saliva drain from the mouth and prevent the tongue from falling backward - Remove all nearby furniture and other hazards from the area - Provide verbal assurance as the individual is  regaining consciousness - Provide the patient with privacy if possible - Call for help and start treatment as ordered by the caregiver   After the Seizure (Postictal Stage)  After a seizure, most patients experience confusion, fatigue, muscle pain and/or a headache. Thus, one should permit the individual to sleep. For the next few days, reassurance is essential. Being calm and helping reorient the person is also of importance.  Most seizures are painless and end spontaneously. Seizures are not harmful to others but can lead to complications such as stress on the lungs, brain and the heart. Individuals with prior lung problems may develop labored breathing and respiratory distress.     Orders Placed This Encounter  Procedures   Lamotrigine level   CBC with Differential/Platelets   Basic Metabolic Panel    Meds ordered this encounter  Medications   LamoTRIgine 300 MG TB24 24 hour tablet    Sig: Take 1 tablet (300 mg total) by mouth daily.    Dispense:  30 tablet    Refill:  11   diazePAM, 15 MG Dose, (VALTOCO 15 MG DOSE) 2 x 7.5 MG/0.1ML LQPK    Sig: Place 7.5 mg into the nose as needed (For seizure lasting more than 2 minutes).    Dispense:  5 each    Refill:  5    Please provide 5 boxes to patient, need to keep some at work and some at home.    Return in about 3 months (around 05/10/2023).    Windell Norfolk, MD 02/09/2023, 8:03 AM  [facility] [address] [place], [place] [address] [phone]

## 2023-02-09 ENCOUNTER — Ambulatory Visit (INDEPENDENT_AMBULATORY_CARE_PROVIDER_SITE_OTHER)
Admission: RE | Admit: 2023-02-09 | Discharge: 2023-02-09 | Disposition: A | Discharge status: Home / Self Care | Payer: 59 | Source: Ambulatory Visit | Attending: Family Medicine | Admitting: Family Medicine

## 2023-02-09 ENCOUNTER — Ambulatory Visit (INDEPENDENT_AMBULATORY_CARE_PROVIDER_SITE_OTHER): Payer: 59 | Admitting: Family Medicine

## 2023-02-09 ENCOUNTER — Encounter: Payer: Self-pay | Admitting: Family Medicine

## 2023-02-09 VITALS — BP 134/86 | HR 77 | Temp 97.9°F | Wt 216.4 lb

## 2023-02-09 DIAGNOSIS — R3589 Other polyuria: Secondary | ICD-10-CM

## 2023-02-09 DIAGNOSIS — J690 Pneumonitis due to inhalation of food and vomit: Secondary | ICD-10-CM

## 2023-02-09 DIAGNOSIS — Z09 Encounter for follow-up examination after completed treatment for conditions other than malignant neoplasm: Secondary | ICD-10-CM

## 2023-02-09 DIAGNOSIS — K521 Toxic gastroenteritis and colitis: Secondary | ICD-10-CM | POA: Diagnosis not present

## 2023-02-09 DIAGNOSIS — R232 Flushing: Secondary | ICD-10-CM | POA: Insufficient documentation

## 2023-02-09 DIAGNOSIS — G4700 Insomnia, unspecified: Secondary | ICD-10-CM

## 2023-02-09 DIAGNOSIS — R569 Unspecified convulsions: Secondary | ICD-10-CM

## 2023-02-09 DIAGNOSIS — T3695XA Adverse effect of unspecified systemic antibiotic, initial encounter: Secondary | ICD-10-CM

## 2023-02-09 LAB — CBC WITH DIFFERENTIAL/PLATELET
Basophils Absolute: 0.1 10*3/uL (ref 0.0–0.2)
Basos: 1 %
EOS (ABSOLUTE): 0.2 10*3/uL (ref 0.0–0.4)
Eos: 2 %
Hematocrit: 42.7 % (ref 37.5–51.0)
Hemoglobin: 14.3 g/dL (ref 13.0–17.7)
Immature Grans (Abs): 0.5 10*3/uL — ABNORMAL HIGH (ref 0.0–0.1)
Immature Granulocytes: 5 %
Lymphocytes Absolute: 2.1 10*3/uL (ref 0.7–3.1)
Lymphs: 22 %
MCH: 28.4 pg (ref 26.6–33.0)
MCHC: 33.5 g/dL (ref 31.5–35.7)
MCV: 85 fL (ref 79–97)
Monocytes Absolute: 0.7 10*3/uL (ref 0.1–0.9)
Monocytes: 7 %
Neutrophils Absolute: 5.8 10*3/uL (ref 1.4–7.0)
Neutrophils: 63 %
Platelets: 447 10*3/uL (ref 150–450)
RBC: 5.04 x10E6/uL (ref 4.14–5.80)
RDW: 12.8 % (ref 11.6–15.4)
WBC: 9.3 10*3/uL (ref 3.4–10.8)

## 2023-02-09 LAB — BASIC METABOLIC PANEL
BUN/Creatinine Ratio: 10 (ref 9–20)
BUN: 11 mg/dL (ref 6–20)
CO2: 26 mmol/L (ref 20–29)
Calcium: 9.5 mg/dL (ref 8.7–10.2)
Chloride: 99 mmol/L (ref 96–106)
Creatinine, Ser: 1.15 mg/dL (ref 0.76–1.27)
Glucose: 100 mg/dL — ABNORMAL HIGH (ref 70–99)
Potassium: 4.4 mmol/L (ref 3.5–5.2)
Sodium: 141 mmol/L (ref 134–144)
eGFR: 88 mL/min/{1.73_m2} (ref >59)

## 2023-02-09 LAB — LAMOTRIGINE LEVEL: Lamotrigine Lvl: 2.6 ug/mL (ref 2.0–20.0)

## 2023-02-09 NOTE — Assessment & Plan Note (Signed)
Sleep quality affected by frequent urination and feelings of heat.  Differential diagnosis:  Sleep apnea Post-seizure sleep pattern disturbances Medication side effects  Plan:  Proceed with planned sleep study to evaluate for obstructive sleep apnea. Adjust sleeping conditions and environment for comfort. Reevaluate medication regimen for potential impact on sleep.

## 2023-02-09 NOTE — Assessment & Plan Note (Signed)
Improving.  Recommend probiotics.  Follow-up as needed

## 2023-02-09 NOTE — Patient Instructions (Signed)
For urinary frequency, we are checking urine. For diarrhea, try yogurt or other probiotics. For hot flashes, we are trying to rule out pneumonia with chest x-ray. For  xray, go to:    [place] [facility] [address] [place], [place], [place] [address] Phone: [phone]

## 2023-02-09 NOTE — Assessment & Plan Note (Signed)
Recent hospitalization for seizure.  Currently following with neurology with recent lamotrigine dosage adjustment.  Plan:  Continue monitoring seizure activity. Consider neurology follow-up as needed

## 2023-02-09 NOTE — Assessment & Plan Note (Signed)
Reports excessive heat and sweating since hospitalization.  Differential diagnosis:  Endocrine imbalance e.g. thyroid Post-seizure autonomic dysregulation Infection  Plan:  Monitor symptoms closely. Reassess after the urine analysis and culture results are returned. Evaluate for endocrine disorders if symptoms persist.

## 2023-02-09 NOTE — Assessment & Plan Note (Signed)
Schedule for repeat chest X-ray to assess pneumonia progression.

## 2023-02-09 NOTE — Assessment & Plan Note (Addendum)
Polyuria, potentially disrupted by recent seizure activity or medical intervention such as catheterization.  Differential diagnosis:  Urinary tract infection Post-catheterization irritation Antibiotic associated acute kidney injury  Plan:  Perform urinalysis with culture to rule out ongoing infection. Monitor fluid intake and output. Consider urology consult if symptoms persist or worsen.

## 2023-02-09 NOTE — Progress Notes (Signed)
Assessment/Plan:   Problem List Items Addressed This Visit       Cardiovascular and Mediastinum   Hot flashes    Reports excessive heat and sweating since hospitalization.  Differential diagnosis:  Endocrine imbalance e.g. thyroid Post-seizure autonomic dysregulation Infection  Plan:  Monitor symptoms closely. Reassess after the urine analysis and culture results are returned. Evaluate for endocrine disorders if symptoms persist.        Respiratory   Aspiration pneumonia (HCC)    Schedule for repeat chest X-ray to assess pneumonia progression.      Relevant Orders   Urinalysis w microscopic + reflex cultur   DG Chest 2 View (Completed)     Digestive   Antibiotic-associated diarrhea    Improving.  Recommend probiotics.  Follow-up as needed        Other   Seizure [facility])    Recent hospitalization for seizure.  Currently following with neurology with recent lamotrigine dosage adjustment.  Plan:  Continue monitoring seizure activity. Consider neurology follow-up as needed      Polyuria - Primary    Polyuria, potentially disrupted by recent seizure activity or medical intervention such as catheterization.  Differential diagnosis:  Urinary tract infection Post-catheterization irritation Antibiotic associated acute kidney injury  Plan:  Perform urinalysis with culture to rule out ongoing infection. Monitor fluid intake and output. Consider urology consult if symptoms persist or worsen.      Insomnia    Sleep quality affected by frequent urination and feelings of heat.  Differential diagnosis:  Sleep apnea Post-seizure sleep pattern disturbances Medication side effects  Plan:  Proceed with planned sleep study to evaluate for obstructive sleep apnea. Adjust sleeping conditions and environment for comfort. Reevaluate medication regimen for potential impact on sleep.      Other Visit Diagnoses     Hospital discharge follow-up            There are no discontinued medications.  Return if symptoms worsen or fail to improve, for Hot flashes and urinary frequency.    Subjective:   Encounter date: 02/09/2023  Joseph Cameron is a 32 y.o. male who has Seizure (HCC); Attention deficit hyperactivity disorder (ADHD); Focal epilepsy with impairment of consciousness (HCC); Screening, lipid; Chest pain; Vitamin D deficiency; Tobacco use; Status epilepticus (HCC); Acute respiratory failure with hypoxia (HCC); AKI (acute kidney injury) (HCC); Aspiration pneumonia (HCC); Rhabdomyolysis; Polyuria; Hot flashes; Insomnia; and Antibiotic-associated diarrhea on their problem list..   He  has a past medical history of Acute tonsillitis (04/07/2013), GERD (gastroesophageal reflux disease), Seizures (HCC), and SYNCOPE, VASOVAGAL (07/05/2009)..   CHIEF COMPLAINT: Patient presents for hospital follow-up with concerns of excessive body heat, lack of sleep, elevated blood pressure, and frequent urination since hospitalization.  HISTORY OF PRESENT ILLNESS:  Seizures: Patient experienced a series of four seizures at night and has a history of seizure disorders. He reports a recent hospital stay following a status epilepticus episode.  Patient recently followed up with his neurologist who adjusted his Lamictal dose to 300 mg to help address symptoms of headache and insomnia.  He has not had a seizure since discharge.    Excessive Heat: Patient has felt excessively hot since the hospital stay, with noticeable sweating.  Urination: Patient reports increased frequency of urination, particularly at night, which may be affecting his sleep.  Review of Systems  Constitutional:  Positive for diaphoresis. Negative for chills and fever.       Feels excessively warm  Respiratory:  Negative for cough, sputum production,  shortness of breath and wheezing.   Cardiovascular:  Negative for chest pain and palpitations.  Gastrointestinal:  Positive for  diarrhea. Negative for abdominal pain, blood in stool, nausea and vomiting.  Genitourinary:  Positive for frequency and urgency. Negative for dysuria, flank pain and hematuria.  Neurological:  Positive for dizziness and seizures (None since discharge).  Endo/Heme/Allergies:  Negative for polydipsia.  Psychiatric/Behavioral:  The patient has insomnia.   All other systems reviewed and are negative.   Past Surgical History:  Procedure Laterality Date   TONSILLECTOMY      Outpatient Medications Prior to Visit  Medication Sig Dispense Refill   acetaminophen (TYLENOL) 325 MG tablet Take 650 mg by mouth every 6 (six) hours as needed for moderate pain. Patient reports 2-3 times a day     diazePAM, 15 MG Dose, (VALTOCO 15 MG DOSE) 2 x 7.5 MG/0.1ML LQPK Place 7.5 mg into the nose as needed (For seizure lasting more than 2 minutes). 5 each 5   LamoTRIgine 300 MG TB24 24 hour tablet Take 1 tablet (300 mg total) by mouth daily. 30 tablet 11   potassium chloride SA (KLOR-CON M) 20 MEQ tablet Take 1 tablet (20 mEq total) by mouth daily for 5 days. 5 tablet 0   No facility-administered medications prior to visit.    Family History  Problem Relation Age of Onset   Hyperlipidemia Mother    Diabetes Mother    Asthma Mother    Hypertension Father    Seizures Maternal Aunt    Diabetes Maternal Grandmother    Leukemia Maternal Grandmother    Obesity Maternal Grandmother    Diabetes Maternal Grandfather    Hypertension Paternal Grandmother    Hypertension Paternal Actor    ADD / ADHD Brother    Depression Brother    Depression Sister     Social History   Socioeconomic History   Marital status: Married    Spouse name: Not on file   Number of children: Not on file   Years of education: Not on file   Highest education level: Doctorate  Occupational History   Not on file  Tobacco Use   Smoking status: Former    Types: Cigarettes   Smokeless tobacco: Current  Vaping Use   Vaping  Use: Every day  Substance and Sexual Activity   Alcohol use: Not Currently    Alcohol/week: 1.0 standard drink of alcohol    Types: 1 Standard drinks or equivalent per week    Comment: Maybe once a month or less   Drug use: Not Currently   Sexual activity: Yes    Birth control/protection: None  Other Topics Concern   Not on file  Social History Narrative   Not on file   Social Determinants of Health   Financial Resource Strain: Low Risk  (02/08/2023)   Overall Financial Resource Strain (CARDIA)    Difficulty of Paying Living Expenses: Not hard at all  Food Insecurity: No Food Insecurity (02/08/2023)   Hunger Vital Sign    Worried About Running Out of Food in the Last Year: Never true    Ran Out of Food in the Last Year: Never true  Transportation Needs: No Transportation Needs (02/08/2023)   PRAPARE - Administrator, Civil Service (Medical): No    Lack of Transportation (Non-Medical): No  Physical Activity: Unknown (02/08/2023)   Exercise Vital Sign    Days of Exercise per Week: Patient declined    Minutes of Exercise per Session: Not  on file  Stress: Stress Concern Present (02/08/2023)   [employer] of Occupational Health - Occupational Stress Questionnaire    Feeling of Stress : To some extent  Social Connections: Moderately Isolated (02/08/2023)   Social Connection and Isolation Panel [NHANES]    Frequency of Communication with Friends and Family: More than three times a week    Frequency of Social Gatherings with Friends and Family: More than three times a week    Attends Religious Services: Never    Database administrator or Organizations: No    Attends Engineer, structural: Not on file    Marital Status: Married  Catering manager Violence: Not on file                                                                                                  Objective:  Physical Exam: BP 134/86 (BP Location: Left Arm, Patient Position: Sitting, Cuff  Size: Large)   Pulse 77   Temp 97.9 F (36.6 C) (Temporal)   Wt 216 lb 6.4 oz (98.2 kg)   SpO2 98%   BMI 27.05 kg/m     Physical Exam Constitutional:      Appearance: Normal appearance.  HENT:     Head: Normocephalic and atraumatic.     Right Ear: Hearing normal.     Left Ear: Hearing normal.     Nose: Nose normal.  Eyes:     General: No scleral icterus.       Right eye: No discharge.        Left eye: No discharge.     Extraocular Movements: Extraocular movements intact.  Cardiovascular:     Rate and Rhythm: Normal rate and regular rhythm.     Heart sounds: Normal heart sounds.  Pulmonary:     Effort: Pulmonary effort is normal.     Breath sounds: Normal breath sounds.  Abdominal:     Palpations: Abdomen is soft.     Tenderness: There is no abdominal tenderness.  Skin:    General: Skin is warm.     Findings: No rash.  Neurological:     General: No focal deficit present.     Mental Status: He is alert.     Cranial Nerves: No cranial nerve deficit.  Psychiatric:        Mood and Affect: Mood normal.        Behavior: Behavior normal.        Thought Content: Thought content normal.        Judgment: Judgment normal.     LABS:  Reviewed recent labs, including CBC, BMP, CK, magnesium, and lamotrigine levels. Findings pertinent to acute kidney injury, which is now resolved.   IMAGING:  Renal ultrasound showed mild increased cortical echogenicity supportive of medical renal disease. MRI brain with no acute findings. Chest X-ray demonstrated alveolar opacity in the left base consistent with pneumonia.  DG Chest 2 View  Result Date: 02/09/2023 CLINICAL DATA:  Prior aspiration and seizure. EXAM: CHEST - 2 VIEW COMPARISON:  Chest radiograph 02/01/2023 FINDINGS: Significant interval improvement and near complete resolution of  previously visualized consolidation left lower hemithorax. Stable cardiac and mediastinal contours. No pleural effusion or pneumothorax. Osseous  structures unremarkable. IMPRESSION: Significant interval improvement and near complete resolution of previously visualized left lower hemithorax consolidation. Electronically Signed   By: Annia Belt M.D.   On: 02/09/2023 14:04   US RENAL  Result Date: 02/02/2023 CLINICAL DATA:  Acute renal insufficiency. EXAM: RENAL / URINARY TRACT ULTRASOUND COMPLETE COMPARISON:  None Available. FINDINGS: Right Kidney: Renal measurements: 12.7 x 5.4 x 6.7 cm = volume: 239 mL. Moderate increased cortical echogenicity. No mass or hydronephrosis visualized. Left Kidney: Renal measurements: 12.6 x 6.4 x 6.9 cm = volume: 292 mL. Moderate increased cortical echogenicity. No mass or hydronephrosis visualized. Bladder: Contracted with Foley catheter present. Other: None. IMPRESSION: Normal size kidneys with moderate increased cortical echogenicity compatible with medical renal disease. No hydronephrosis. Electronically Signed   By: Elberta Fortis M.D.   On: 02/02/2023 09:23   MR BRAIN WO CONTRAST  Result Date: 02/01/2023 CLINICAL DATA:  Seizure, new-onset, no history of trauma EXAM: MRI HEAD WITHOUT CONTRAST TECHNIQUE: Multiplanar, multiecho pulse sequences of the brain and surrounding structures were obtained without intravenous contrast. COMPARISON:  MRI head July 16, 2008. FINDINGS: Brain: No acute infarction, hemorrhage, hydrocephalus, extra-axial collection or mass lesion. A few punctate foci of susceptibility artifact in the white matter are nonspecific but considered within normal limits for patient age. Hippocampi are symmetric and within normal limits. Vascular: Major arterial flow voids are maintained at the skull base. Skull and upper cervical spine: Normal marrow signal. Sinuses/Orbits: Paranasal sinus mucosal thickening. No acute orbital findings. Other: Small bilateral mastoid effusions. IMPRESSION: 1. No acute intracranial abnormality. 2. Partially empty sella, which is often a normal anatomic variant but can be  associated with idiopathic intracranial hypertension. 3. Paranasal sinus mucosal thickening and small mastoid effusions. Electronically Signed   By: Feliberto Harts M.D.   On: 02/01/2023 18:06   DG CHEST PORT 1 VIEW  Result Date: 02/01/2023 CLINICAL DATA:  Fever EXAM: PORTABLE CHEST 1 VIEW COMPARISON:  01/30/2023 FINDINGS: Alveolar opacity left base consistent with pneumonia similar to the prior study. Mild pulmonary vascular congestion. No pneumothorax. There may be small pleural effusion on the left. Endotracheal tube tip at the thoracic inlet. NG tube tip diaphragm superimposed with stomach. IMPRESSION: Left base consolidation consistent with pneumonia or volume loss and small left-sided pleural effusion. Electronically Signed   By: Layla Maw M.D.   On: 02/01/2023 10:49   Overnight EEG with video  Result Date: 01/31/2023 Charlsie Quest, MD     02/01/2023 10:39 AM Patient Name: TEAGAN HEIDRICK MRN: [id number] Epilepsy Attending: Charlsie Quest Referring Physician/Provider: Sanjuana Letters, PA-C Duration: 01/30/2023 1605 to 01/31/2023 1605  Patient history: 31yo M with status epilepticus, getting eeg to evaluate for seizure.  Level of alertness:  comatose  AEDs during EEG study: Propofol, Versed, LTG, LEV  Technical aspects: This EEG study was done with scalp electrodes positioned according to the 10-20 International system of electrode placement. Electrical activity was reviewed with band pass filter of 1-70Hz , sensitivity of 7 uV/mm, display speed of 53mm/sec with a 60Hz  notched filter applied as appropriate. EEG data were recorded continuously and digitally stored.  Video monitoring was available and reviewed as appropriate.  Description: EEG showed burst suppression with burst of generalized sharply contoured 2-5Hz  theta-delta slowing admixed with 12-13Hz  beta activity lasting 2-3 seconds alternating with generalized suppression lasting 3-4 seconds.  As sedation was weaned, EEG appeared  more continuous and  showed continuous generalized 3 to 7 Hz theta-delta slowing.  Hyperventilation and photic stimulation were not performed.    ABNORMALITY -Burst suppression, generalized  IMPRESSION: This study is suggestive of profound diffuse encephalopathy likely related to sedation.  As sedation was weaned, EEG improved and was suggestive of moderate to severe diffuse encephalopathy.  No seizures or epileptiform discharges were seen throughout the recording.  Charlsie Quest  DG Chest Port 1 View  Result Date: 01/30/2023 CLINICAL DATA:  Endotracheal intubation. EXAM: PORTABLE CHEST 1 VIEW COMPARISON:  None Available. FINDINGS: Endotracheal tube with distal tip approximately 5 cm above the carina. Feeding tube coursing below the diaphragm with distal tip not included. The heart size and mediastinal contours are within normal limits. Both lungs are clear. The visualized skeletal structures are unremarkable. IMPRESSION: Endotracheal tube with distal tip approximately 5 cm above the carina. Electronically Signed   By: Larose Hires D.O.   On: 01/30/2023 15:18   CT Head Wo Contrast  Result Date: 01/30/2023 CLINICAL DATA:  Seizures EXAM: CT HEAD WITHOUT CONTRAST TECHNIQUE: Contiguous axial images were obtained from the base of the skull through the vertex without intravenous contrast. RADIATION DOSE REDUCTION: This exam was performed according to the departmental dose-optimization program which includes automated exposure control, adjustment of the mA and/or kV according to patient size and/or use of iterative reconstruction technique. COMPARISON:  07/31/2022 FINDINGS: Brain: No evidence of acute infarction, hemorrhage, mass, mass effect, or midline shift. No hydrocephalus or extra-axial fluid collection. Vascular: No hyperdense vessel. Skull: Negative for fracture or focal lesion. Sinuses/Orbits: Mild mucosal thickening in the ethmoid air cells. No acute finding in the orbits. Other: The mastoid air cells  are well aerated. Gastric and endotracheal tubes noted. IMPRESSION: No acute intracranial process. Electronically Signed   By: Wiliam Ke M.D.   On: 01/30/2023 14:14   EEG adult  Result Date: 01/30/2023 Charlsie Quest, MD     01/30/2023  3:58 PM Patient Name: YOUSSEF FOOTMAN MRN: [id number] Epilepsy Attending: Charlsie Quest Referring Physician/Provider: Loetta Rough, MD Date: 01/30/2023 Duration: 22.09 mins Patient history: 32yo M with status epilepticus, getting eeg to evaluate for seizure. Level of alertness:  comatose AEDs during EEG study: Propofol, Versed Technical aspects: This EEG study was done with scalp electrodes positioned according to the 10-20 International system of electrode placement. Electrical activity was reviewed with band pass filter of 1-70Hz , sensitivity of 7 uV/mm, display speed of 39mm/sec with a 60Hz  notched filter applied as appropriate. EEG data were recorded continuously and digitally stored.  Video monitoring was available and reviewed as appropriate. Description: EEG showed burst suppression with burst of generalized sharply contoured 12-13Hz  beta activity lasting 2-3 seconds alternating with generalized suppression lasting 3-4 seconds.  Hyperventilation and photic stimulation were not performed.   ABNORMALITY -Burst suppression, generalized IMPRESSION: This study is suggestive of profound diffuse encephalopathy likely related to sedation. No seizures or epileptiform discharges were seen throughout the recording. Charlsie Quest   DG Chest Portable 1 View  Result Date: 01/30/2023 CLINICAL DATA:  Intubation EXAM: PORTABLE CHEST 1 VIEW, portable abdominal x-ray for tube placement COMPARISON:  Chest x-ray 10/17/2022 FINDINGS: New ET tube seen with tip proximally 5 cm above the carina. Enteric tube in place with tip overlying the fundus of the stomach. Subtle opacity left lung base. Infiltrate is possible. No pneumothorax, effusion or edema. Normal  cardiopericardial silhouette. Overlapping cardiac leads. Gas seen along nondilated loops of bowel in the visualized upper abdomen elsewhere. IMPRESSION: ET  tube and enteric tube in place. Subtle left lung base opacity.  Recommend follow-up Electronically Signed   By: Karen Kays M.D.   On: 01/30/2023 11:09   DG Abdomen 1 View  Result Date: 01/30/2023 CLINICAL DATA:  Intubation EXAM: PORTABLE CHEST 1 VIEW, portable abdominal x-ray for tube placement COMPARISON:  Chest x-ray 10/17/2022 FINDINGS: New ET tube seen with tip proximally 5 cm above the carina. Enteric tube in place with tip overlying the fundus of the stomach. Subtle opacity left lung base. Infiltrate is possible. No pneumothorax, effusion or edema. Normal cardiopericardial silhouette. Overlapping cardiac leads. Gas seen along nondilated loops of bowel in the visualized upper abdomen elsewhere. IMPRESSION: ET tube and enteric tube in place. Subtle left lung base opacity.  Recommend follow-up Electronically Signed   By: Karen Kays M.D.   On: 01/30/2023 11:09    Recent Results (from the past 2160 hour(s))  CBG monitoring, ED     Status: None   Collection Time: 01/30/23  2:25 AM  Result Value Ref Range   Glucose-Capillary 94 70 - 99 mg/dL    Comment: Glucose reference range applies only to samples taken after fasting for at least 8 hours.   Comment 1 Notify RN   CBC with Differential     Status: None   Collection Time: 01/30/23  3:10 AM  Result Value Ref Range   WBC 10.1 4.0 - 10.5 K/uL   RBC 5.23 4.22 - 5.81 MIL/uL   Hemoglobin 15.2 13.0 - 17.0 g/dL   HCT 09.8 11.9 - 14.7 %   MCV 85.5 80.0 - 100.0 fL   MCH 29.1 26.0 - 34.0 pg   MCHC 34.0 30.0 - 36.0 g/dL   RDW 82.9 56.2 - 13.0 %   Platelets 212 150 - 400 K/uL   nRBC 0.0 0.0 - 0.2 %   Neutrophils Relative % 70 %   Neutro Abs 7.1 1.7 - 7.7 K/uL   Lymphocytes Relative 21 %   Lymphs Abs 2.1 0.7 - 4.0 K/uL   Monocytes Relative 7 %   Monocytes Absolute 0.7 0.1 - 1.0 K/uL    Eosinophils Relative 1 %   Eosinophils Absolute 0.1 0.0 - 0.5 K/uL   Basophils Relative 0 %   Basophils Absolute 0.0 0.0 - 0.1 K/uL   Immature Granulocytes 1 %   Abs Immature Granulocytes 0.05 0.00 - 0.07 K/uL    Comment: Performed at [facility], [address], [place], [place] [address]  Basic metabolic panel     Status: Abnormal   Collection Time: 01/30/23  3:10 AM  Result Value Ref Range   Sodium 138 135 - 145 mmol/L   Potassium 3.5 3.5 - 5.1 mmol/L   Chloride 101 98 - 111 mmol/L   CO2 26 22 - 32 mmol/L   Glucose, Bld 118 (H) 70 - 99 mg/dL    Comment: Glucose reference range applies only to samples taken after fasting for at least 8 hours.   BUN 13 6 - 20 mg/dL   Creatinine, Ser 4.69 0.61 - 1.24 mg/dL   Calcium 9.1 8.9 - 62.9 mg/dL   GFR, Estimated >52 >84 mL/min    Comment: (NOTE) Calculated using the CKD-EPI Creatinine Equation (2021)    Anion gap 11 5 - 15    Comment: Performed at [facility], [address], [place], [place] [address]  Lamotrigine level     Status: None   Collection Time: 01/30/23  3:10 AM  Result Value Ref  Range   Lamotrigine Lvl 2.0 2.0 - 20.0 ug/mL    Comment: (NOTE)                                Detection Limit = 1.0 Performed At: [facility] [address] [place], [place] [id number] Jolene Schimke MD QM:[id number]   CBC with Differential     Status: Abnormal   Collection Time: 01/30/23  9:59 AM  Result Value Ref Range   WBC 20.5 (H) 4.0 - 10.5 K/uL   RBC 5.80 4.22 - 5.81 MIL/uL   Hemoglobin 16.8 13.0 - 17.0 g/dL   HCT 28.4 (H) 13.2 - 44.0 %   MCV 91.6 80.0 - 100.0 fL    Comment: REPEATED TO VERIFY DELTA CHECK NOTED    MCH 29.0 26.0 - 34.0 pg   MCHC 31.6 30.0 - 36.0 g/dL   RDW 10.2 72.5 - 36.6 %   Platelets 251 150 - 400 K/uL   nRBC 0.0 0.0 - 0.2 %   Neutrophils Relative % 75 %   Neutro Abs 15.4 (H) 1.7 - 7.7 K/uL   Lymphocytes Relative 15 %   Lymphs Abs 3.0 0.7 - 4.0 K/uL    Monocytes Relative 9 %   Monocytes Absolute 1.8 (H) 0.1 - 1.0 K/uL   Eosinophils Relative 0 %   Eosinophils Absolute 0.0 0.0 - 0.5 K/uL   Basophils Relative 0 %   Basophils Absolute 0.1 0.0 - 0.1 K/uL   Immature Granulocytes 1 %   Abs Immature Granulocytes 0.14 (H) 0.00 - 0.07 K/uL    Comment: Performed at [facility], [address], [place], [place] [address]  Comprehensive metabolic panel     Status: Abnormal   Collection Time: 01/30/23 10:47 AM  Result Value Ref Range   Sodium 137 135 - 145 mmol/L   Potassium 3.8 3.5 - 5.1 mmol/L   Chloride 105 98 - 111 mmol/L   CO2 15 (L) 22 - 32 mmol/L   Glucose, Bld 122 (H) 70 - 99 mg/dL    Comment: Glucose reference range applies only to samples taken after fasting for at least 8 hours.   BUN 13 6 - 20 mg/dL   Creatinine, Ser 7.42 0.61 - 1.24 mg/dL   Calcium 9.1 8.9 - 59.5 mg/dL   Total Protein 8.7 (H) 6.5 - 8.1 g/dL   Albumin 5.0 3.5 - 5.0 g/dL   AST 52 (H) 15 - 41 U/L   ALT 31 0 - 44 U/L   Alkaline Phosphatase 54 38 - 126 U/L   Total Bilirubin 0.9 0.3 - 1.2 mg/dL   GFR, Estimated >63 >87 mL/min    Comment: (NOTE) Calculated using the CKD-EPI Creatinine Equation (2021)    Anion gap 17 (H) 5 - 15    Comment: Performed at [facility], [address], [place], [place] [address]  CK     Status: Abnormal   Collection Time: 01/30/23 10:47 AM  Result Value Ref Range   Total CK 1,465 (H) 49 - 397 U/L    Comment: Performed at [facility], [address], [place], [place] [address]  Magnesium     Status: Abnormal   Collection Time: 01/30/23 10:47 AM  Result Value Ref Range   Magnesium 2.5 (H) 1.7 - 2.4 mg/dL    Comment: Performed at [facility], [address], [place], [place] [address]  POC CBG, ED  Status: Abnormal   Collection Time: 01/30/23 11:04 AM  Result Value Ref Range   Glucose-Capillary 129 (H) 70 - 99 mg/dL    Comment: Glucose reference range  applies only to samples taken after fasting for at least 8 hours.  Lactic acid, plasma     Status: Abnormal   Collection Time: 01/30/23 12:02 PM  Result Value Ref Range   Lactic Acid, Venous 3.8 (HH) 0.5 - 1.9 mmol/L    Comment: CRITICAL RESULT CALLED TO, READ BACK BY AND VERIFIED WITH RN L SINCLAIR AT 1237 01/30/23 CRUICKSHANK A Performed at [facility], [address], [place], [place] [address]   Blood gas, venous     Status: Abnormal   Collection Time: 01/30/23 12:02 PM  Result Value Ref Range   pH, Ven 7.35 7.25 - 7.43   pCO2, Ven 34 (L) 44 - 60 mmHg   pO2, Ven 46 (H) 32 - 45 mmHg   Bicarbonate 18.8 (L) 20.0 - 28.0 mmol/L   Acid-base deficit 5.9 (H) 0.0 - 2.0 mmol/L   O2 Saturation 81.5 %   Patient temperature 37.0     Comment: Performed at [facility], [address], [place], [place] [address]  Glucose, capillary     Status: Abnormal   Collection Time: 01/30/23  2:27 PM  Result Value Ref Range   Glucose-Capillary 114 (H) 70 - 99 mg/dL    Comment: Glucose reference range applies only to samples taken after fasting for at least 8 hours.  MRSA Next Gen by PCR, Nasal     Status: None   Collection Time: 01/30/23  2:44 PM   Specimen: Nasal Mucosa; Nasal Swab  Result Value Ref Range   MRSA by PCR Next Gen NOT DETECTED NOT DETECTED    Comment: (NOTE) The GeneXpert MRSA Assay (FDA approved for NASAL specimens only), is one component of a comprehensive MRSA colonization surveillance program. It is not intended to diagnose MRSA infection nor to guide or monitor treatment for MRSA infections. Test performance is not FDA approved in patients less than 77 years old. Performed at [facility], [address], [place], [place] [address]   I-STAT 7, (LYTES, BLD GAS, ICA, H+H)     Status: Abnormal   Collection Time: 01/30/23  3:02 PM  Result Value Ref Range   pH, Arterial 7.389 7.35 - 7.45   pCO2 arterial 30.9 (L) 32 - 48 mmHg   pO2,  Arterial 388 (H) 83 - 108 mmHg   Bicarbonate 18.6 (L) 20.0 - 28.0 mmol/L   TCO2 20 (L) 22 - 32 mmol/L   O2 Saturation 100 %   Acid-base deficit 5.0 (H) 0.0 - 2.0 mmol/L   Sodium 137 135 - 145 mmol/L   Potassium 3.7 3.5 - 5.1 mmol/L   Calcium, Ion 1.20 1.15 - 1.40 mmol/L   HCT 41.0 39.0 - 52.0 %   Hemoglobin 13.9 13.0 - 17.0 g/dL   Patient temperature 91.4 F    Collection site RADIAL, ALLEN'S TEST ACCEPTABLE    Drawn by RT    Sample type ARTERIAL   HIV Antibody (routine testing w rflx)     Status: None   Collection Time: 01/30/23  4:33 PM  Result Value Ref Range   HIV Screen 4th Generation wRfx Non Reactive Non Reactive    Comment: Performed at [facility], [address], [place], [place] [address]  CBC     Status: Abnormal   Collection Time: 01/30/23  4:33 PM  Result Value Ref Range   WBC 14.9 (H) 4.0 - 10.5 K/uL   RBC 5.12 4.22 - 5.81 MIL/uL   Hemoglobin 14.9 13.0 - 17.0 g/dL   HCT 82.9 56.2 - 13.0 %   MCV 85.4 80.0 - 100.0 fL   MCH 29.1 26.0 - 34.0 pg   MCHC 34.1 30.0 - 36.0 g/dL   RDW 86.5 78.4 - 69.6 %   Platelets 228 150 - 400 K/uL   nRBC 0.0 0.0 - 0.2 %    Comment: Performed at [facility], [address] [place], [place] [address]  Creatinine, serum     Status: Abnormal   Collection Time: 01/30/23  4:33 PM  Result Value Ref Range   Creatinine, Ser 1.37 (H) 0.61 - 1.24 mg/dL   GFR, Estimated >41 >32 mL/min    Comment: (NOTE) Calculated using the CKD-EPI Creatinine Equation (2021) Performed at [facility], [address] [place], [place] [address]   Glucose, capillary     Status: None   Collection Time: 01/30/23  6:46 PM  Result Value Ref Range   Glucose-Capillary 75 70 - 99 mg/dL    Comment: Glucose reference range applies only to samples taken after fasting for at least 8 hours.  Glucose, capillary     Status: Abnormal   Collection Time: 01/30/23  7:56 PM  Result Value Ref Range   Glucose-Capillary 61 (L) 70 - 99 mg/dL    Comment:  Glucose reference range applies only to samples taken after fasting for at least 8 hours.  Glucose, capillary     Status: Abnormal   Collection Time: 01/30/23  8:28 PM  Result Value Ref Range   Glucose-Capillary 103 (H) 70 - 99 mg/dL    Comment: Glucose reference range applies only to samples taken after fasting for at least 8 hours.  Glucose, capillary     Status: Abnormal   Collection Time: 01/30/23 11:52 PM  Result Value Ref Range   Glucose-Capillary 53 (L) 70 - 99 mg/dL    Comment: Glucose reference range applies only to samples taken after fasting for at least 8 hours.  Glucose, capillary     Status: None   Collection Time: 01/31/23 12:30 AM  Result Value Ref Range   Glucose-Capillary 85 70 - 99 mg/dL    Comment: Glucose reference range applies only to samples taken after fasting for at least 8 hours.  Glucose, capillary     Status: None   Collection Time: 01/31/23  3:46 AM  Result Value Ref Range   Glucose-Capillary 74 70 - 99 mg/dL    Comment: Glucose reference range applies only to samples taken after fasting for at least 8 hours.  Basic metabolic panel     Status: Abnormal   Collection Time: 01/31/23  7:24 AM  Result Value Ref Range   Sodium 139 135 - 145 mmol/L   Potassium 3.1 (L) 3.5 - 5.1 mmol/L   Chloride 106 98 - 111 mmol/L   CO2 21 (L) 22 - 32 mmol/L   Glucose, Bld 87 70 - 99 mg/dL    Comment: Glucose reference range applies only to samples taken after fasting for at least 8 hours.   BUN 14 6 - 20 mg/dL   Creatinine, Ser 2.72 (H) 0.61 - 1.24 mg/dL   Calcium 8.8 (L) 8.9 - 10.3 mg/dL   GFR, Estimated 40 (L) >60 mL/min    Comment: (NOTE) Calculated using the CKD-EPI Creatinine Equation (2021)  Anion gap 12 5 - 15    Comment: Performed at [facility], [address], [place], [place] [address]  Triglycerides     Status: Abnormal   Collection Time: 01/31/23  7:24 AM  Result Value Ref Range   Triglycerides 269 (H) <150 mg/dL    Comment: Performed at [facility], [address], [place], [place] [address]  Magnesium     Status: None   Collection Time: 01/31/23  7:24 AM  Result Value Ref Range   Magnesium 2.3 1.7 - 2.4 mg/dL    Comment: Performed at [facility], [address], [place], [place] [address]  CK     Status: Abnormal   Collection Time: 01/31/23  7:24 AM  Result Value Ref Range   Total CK 1,971 (H) 49 - 397 U/L    Comment: Performed at [facility], [address], [place], [place] [address]  Glucose, capillary     Status: Abnormal   Collection Time: 01/31/23  8:14 AM  Result Value Ref Range   Glucose-Capillary 56 (L) 70 - 99 mg/dL    Comment: Glucose reference range applies only to samples taken after fasting for at least 8 hours.  Glucose, capillary     Status: None   Collection Time: 01/31/23  9:01 AM  Result Value Ref Range   Glucose-Capillary 78 70 - 99 mg/dL    Comment: Glucose reference range applies only to samples taken after fasting for at least 8 hours.  Phosphorus     Status: Abnormal   Collection Time: 01/31/23  9:09 AM  Result Value Ref Range   Phosphorus 1.7 (L) 2.5 - 4.6 mg/dL    Comment: Performed at [facility], [address] [place], [place] [address]  Glucose, capillary     Status: None   Collection Time: 01/31/23 11:16 AM  Result Value Ref Range   Glucose-Capillary 74 70 - 99 mg/dL    Comment: Glucose reference range applies only to samples taken after fasting for at least 8 hours.  Glucose, capillary     Status: Abnormal   Collection Time: 01/31/23  3:32 PM  Result Value Ref Range   Glucose-Capillary 65 (L) 70 - 99 mg/dL    Comment: Glucose reference range applies only to samples taken after fasting for at least 8 hours.  Magnesium     Status: None   Collection Time: 01/31/23  5:56 PM  Result Value Ref Range   Magnesium 1.9 1.7 - 2.4 mg/dL    Comment: Performed at [facility], [address], [place], [place] [address]  Phosphorus     Status: None   Collection  Time: 01/31/23  5:56 PM  Result Value Ref Range   Phosphorus 3.4 2.5 - 4.6 mg/dL    Comment: Performed at [facility], [address], [place], [place] [address]  Basic metabolic panel     Status: Abnormal   Collection Time: 01/31/23  5:56 PM  Result Value Ref Range   Sodium 136 135 - 145 mmol/L   Potassium 3.5 3.5 - 5.1 mmol/L   Chloride 108 98 - 111 mmol/L   CO2 16 (L) 22 - 32 mmol/L   Glucose, Bld 131 (H) 70 - 99 mg/dL    Comment: Glucose reference range applies only to samples taken after fasting for at least 8 hours.   BUN 19 6 - 20 mg/dL   Creatinine, Ser 5.28 (H) 0.61 -  1.24 mg/dL   Calcium 8.5 (L) 8.9 - 10.3 mg/dL   GFR, Estimated 34 (L) >60 mL/min    Comment: (NOTE) Calculated using the CKD-EPI Creatinine Equation (2021)    Anion gap 12 5 - 15    Comment: Performed at [facility], [address] [place], [place] [address]  Glucose, capillary     Status: Abnormal   Collection Time: 01/31/23  7:59 PM  Result Value Ref Range   Glucose-Capillary 127 (H) 70 - 99 mg/dL    Comment: Glucose reference range applies only to samples taken after fasting for at least 8 hours.  Glucose, capillary     Status: Abnormal   Collection Time: 01/31/23 11:41 PM  Result Value Ref Range   Glucose-Capillary 119 (H) 70 - 99 mg/dL    Comment: Glucose reference range applies only to samples taken after fasting for at least 8 hours.  Glucose, capillary     Status: Abnormal   Collection Time: 02/01/23  4:13 AM  Result Value Ref Range   Glucose-Capillary 143 (H) 70 - 99 mg/dL    Comment: Glucose reference range applies only to samples taken after fasting for at least 8 hours.  Magnesium     Status: None   Collection Time: 02/01/23  6:43 AM  Result Value Ref Range   Magnesium 2.0 1.7 - 2.4 mg/dL    Comment: Performed at [facility], [address], [place], [place] [address]  Phosphorus     Status: None   Collection Time: 02/01/23  6:43 AM  Result Value Ref Range    Phosphorus 4.1 2.5 - 4.6 mg/dL    Comment: Performed at [facility], [address], [place], [place] [address]  CBC     Status: None   Collection Time: 02/01/23  6:43 AM  Result Value Ref Range   WBC 8.5 4.0 - 10.5 K/uL   RBC 4.70 4.22 - 5.81 MIL/uL   Hemoglobin 13.8 13.0 - 17.0 g/dL   HCT 91.4 78.2 - 95.6 %   MCV 84.0 80.0 - 100.0 fL   MCH 29.4 26.0 - 34.0 pg   MCHC 34.9 30.0 - 36.0 g/dL   RDW 21.3 08.6 - 57.8 %   Platelets 188 150 - 400 K/uL   nRBC 0.0 0.0 - 0.2 %    Comment: Performed at [facility], [address] [place], [place] [address]  Basic metabolic panel     Status: Abnormal   Collection Time: 02/01/23  6:43 AM  Result Value Ref Range   Sodium 140 135 - 145 mmol/L   Potassium 3.9 3.5 - 5.1 mmol/L   Chloride 110 98 - 111 mmol/L   CO2 19 (L) 22 - 32 mmol/L   Glucose, Bld 107 (H) 70 - 99 mg/dL    Comment: Glucose reference range applies only to samples taken after fasting for at least 8 hours.   BUN 22 (H) 6 - 20 mg/dL   Creatinine, Ser 9.52 (H) 0.61 - 1.24 mg/dL   Calcium 8.2 (L) 8.9 - 10.3 mg/dL   GFR, Estimated 34 (L) >60 mL/min    Comment: (NOTE) Calculated using the CKD-EPI Creatinine Equation (2021)    Anion gap 11 5 - 15    Comment: Performed at [facility], [address] [place], [place] [address]  Glucose, capillary     Status: Abnormal   Collection Time: 02/01/23  8:16 AM  Result Value Ref Range  Glucose-Capillary 117 (H) 70 - 99 mg/dL    Comment: Glucose reference range applies only to samples taken after fasting for at least 8 hours.  Culture, Respiratory w Gram Stain     Status: None   Collection Time: 02/01/23  9:22 AM   Specimen: Tracheal Aspirate; Respiratory  Result Value Ref Range   Specimen Description TRACHEAL ASPIRATE    Special Requests NONE    Gram Stain NO WBC SEEN MODERATE GRAM NEGATIVE RODS     Culture      ABUNDANT STREPTOCOCCUS GROUP C Beta hemolytic streptococci are predictably susceptible to penicillin  and other beta lactams. Susceptibility testing not routinely performed. WITHIN NORMAL RESPIRATORY FLORA Performed at [facility], [address] [place], [place] [address]    Report Status 02/03/2023 FINAL   Culture, blood (Routine X 2) w Reflex to ID Panel     Status: None   Collection Time: 02/01/23  9:48 AM   Specimen: BLOOD LEFT HAND  Result Value Ref Range   Specimen Description BLOOD LEFT HAND    Special Requests      BOTTLES DRAWN AEROBIC AND ANAEROBIC Blood Culture adequate volume   Culture      NO GROWTH 5 DAYS Performed at [facility] [place] [facility], [address] [place], [place] [address]    Report Status 02/06/2023 FINAL   Culture, blood (Routine X 2) w Reflex to ID Panel     Status: None   Collection Time: 02/01/23  9:49 AM   Specimen: BLOOD LEFT HAND  Result Value Ref Range   Specimen Description BLOOD LEFT HAND    Special Requests      BOTTLES DRAWN AEROBIC AND ANAEROBIC Blood Culture adequate volume   Culture      NO GROWTH 5 DAYS Performed at [facility], [address], [place], [place] [address]    Report Status 02/06/2023 FINAL   Legionella Pneumophila Serogp 1 Ur Ag     Status: None   Collection Time: 02/01/23 10:00 AM  Result Value Ref Range   L. pneumophila Serogp 1 Ur Ag Negative Negative    Comment: (NOTE) Presumptive negative for L. pneumophila serogroup 1 antigen in urine, suggesting no recent or current infection. Legionnaires' disease cannot be ruled out since other serogroups and species may also cause disease. Performed At: [facility] [address] [place], [place] [id number] Jolene Schimke MD OZ:[id number]   Glucose, capillary     Status: Abnormal   Collection Time: 02/01/23 11:18 AM  Result Value Ref Range   Glucose-Capillary 173 (H) 70 - 99 mg/dL    Comment: Glucose reference range applies only to samples taken after fasting for at least 8 hours.  Glucose, capillary     Status: Abnormal   Collection Time: 02/01/23   4:01 PM  Result Value Ref Range   Glucose-Capillary 113 (H) 70 - 99 mg/dL    Comment: Glucose reference range applies only to samples taken after fasting for at least 8 hours.  Glucose, capillary     Status: Abnormal   Collection Time: 02/01/23  7:27 PM  Result Value Ref Range   Glucose-Capillary 119 (H) 70 - 99 mg/dL    Comment: Glucose reference range applies only to samples taken after fasting for at least 8 hours.  Magnesium     Status: None   Collection Time: 02/01/23  7:29 PM  Result Value Ref Range   Magnesium 1.7 1.7 - 2.4 mg/dL    Comment: Performed at  [facility], [address] [place] [place], [place] [address]  Phosphorus     Status: Abnormal   Collection Time: 02/01/23  7:29 PM  Result Value Ref Range   Phosphorus 4.9 (H) 2.5 - 4.6 mg/dL    Comment: Performed at [facility], [address] [place] [address] [place], [place] [address]  Glucose, capillary     Status: Abnormal   Collection Time: 02/01/23 11:25 PM  Result Value Ref Range   Glucose-Capillary 120 (H) 70 - 99 mg/dL    Comment: Glucose reference range applies only to samples taken after fasting for at least 8 hours.  Glucose, capillary     Status: Abnormal   Collection Time: 02/02/23  3:31 AM  Result Value Ref Range   Glucose-Capillary 136 (H) 70 - 99 mg/dL    Comment: Glucose reference range applies only to samples taken after fasting for at least 8 hours.  Magnesium     Status: None   Collection Time: 02/02/23  4:38 AM  Result Value Ref Range   Magnesium 1.7 1.7 - 2.4 mg/dL    Comment: Performed at [facility], [address], [place], [place] [address]  Basic metabolic panel     Status: Abnormal   Collection Time: 02/02/23  4:38 AM  Result Value Ref Range   Sodium 140 135 - 145 mmol/L   Potassium 4.3 3.5 - 5.1 mmol/L   Chloride 109 98 - 111 mmol/L   CO2 21 (L) 22 - 32 mmol/L   Glucose, Bld 172 (H) 70 - 99 mg/dL    Comment: Glucose reference range applies only to samples taken after fasting for at least  8 hours.   BUN 39 (H) 6 - 20 mg/dL   Creatinine, Ser 9.14 (H) 0.61 - 1.24 mg/dL   Calcium 8.0 (L) 8.9 - 10.3 mg/dL   GFR, Estimated 25 (L) >60 mL/min    Comment: (NOTE) Calculated using the CKD-EPI Creatinine Equation (2021)    Anion gap 10 5 - 15    Comment: Performed at [facility], [address], [place], [place] [address]  CBC     Status: Abnormal   Collection Time: 02/02/23  4:38 AM  Result Value Ref Range   WBC 8.3 4.0 - 10.5 K/uL   RBC 4.08 (L) 4.22 - 5.81 MIL/uL   Hemoglobin 11.7 (L) 13.0 - 17.0 g/dL   HCT 62.1 (L) 30.8 - 65.7 %   MCV 86.0 80.0 - 100.0 fL   MCH 28.7 26.0 - 34.0 pg   MCHC 33.3 30.0 - 36.0 g/dL   RDW 84.6 96.2 - 95.2 %   Platelets 171 150 - 400 K/uL   nRBC 0.0 0.0 - 0.2 %    Comment: Performed at [facility], [address] [place], [place] [address]  CK     Status: Abnormal   Collection Time: 02/02/23  4:38 AM  Result Value Ref Range   Total CK 2,704 (H) 49 - 397 U/L    Comment: Performed at [facility], [address] [place], [place] [address]  Glucose, capillary     Status: Abnormal   Collection Time: 02/02/23  7:54 AM  Result Value Ref Range   Glucose-Capillary 142 (H) 70 - 99 mg/dL    Comment: Glucose reference range applies only to samples taken after fasting for at least 8 hours.  Glucose, capillary     Status: Abnormal   Collection Time: 02/02/23 11:04 AM  Result  Value Ref Range   Glucose-Capillary 107 (H) 70 - 99 mg/dL    Comment: Glucose reference range applies only to samples taken after fasting for at least 8 hours.  Strep pneumoniae urinary antigen     Status: None   Collection Time: 02/02/23  3:12 PM  Result Value Ref Range   Strep Pneumo Urinary Antigen NEGATIVE NEGATIVE    Comment:        Infection due to S. pneumoniae cannot be absolutely ruled out since the antigen present may be below the detection limit of the test. Performed at [facility], [address], [place], [place] [address]    Urinalysis, Complete w Microscopic -Urine, Unspecified Source     Status: Abnormal   Collection Time: 02/02/23  3:12 PM  Result Value Ref Range   Color, Urine YELLOW YELLOW   APPearance CLOUDY (A) CLEAR   Specific Gravity, Urine 1.015 1.005 - 1.030   pH 5.0 5.0 - 8.0   Glucose, UA NEGATIVE NEGATIVE mg/dL   Hgb urine dipstick LARGE (A) NEGATIVE   Bilirubin Urine NEGATIVE NEGATIVE   Ketones, ur NEGATIVE NEGATIVE mg/dL   Protein, ur 604 (A) NEGATIVE mg/dL   Nitrite NEGATIVE NEGATIVE   Leukocytes,Ua NEGATIVE NEGATIVE   RBC / HPF >50 0 - 5 RBC/hpf   WBC, UA 0-5 0 - 5 WBC/hpf   Bacteria, UA RARE (A) NONE SEEN   Squamous Epithelial / HPF 0-5 0 - 5 /HPF   Mucus PRESENT    Uric Acid Crys, UA PRESENT     Comment: Performed at [facility], [address] [place], [place] [address]  Glucose, capillary     Status: Abnormal   Collection Time: 02/02/23  3:53 PM  Result Value Ref Range   Glucose-Capillary 113 (H) 70 - 99 mg/dL    Comment: Glucose reference range applies only to samples taken after fasting for at least 8 hours.  Glucose, capillary     Status: Abnormal   Collection Time: 02/02/23  7:44 PM  Result Value Ref Range   Glucose-Capillary 112 (H) 70 - 99 mg/dL    Comment: Glucose reference range applies only to samples taken after fasting for at least 8 hours.  Glucose, capillary     Status: Abnormal   Collection Time: 02/02/23 11:42 PM  Result Value Ref Range   Glucose-Capillary 106 (H) 70 - 99 mg/dL    Comment: Glucose reference range applies only to samples taken after fasting for at least 8 hours.  Basic metabolic panel     Status: Abnormal   Collection Time: 02/03/23 12:51 AM  Result Value Ref Range   Sodium 136 135 - 145 mmol/L   Potassium 4.0 3.5 - 5.1 mmol/L   Chloride 106 98 - 111 mmol/L   CO2 20 (L) 22 - 32 mmol/L   Glucose, Bld 114 (H) 70 - 99 mg/dL    Comment: Glucose reference range applies only to samples taken after fasting for at least 8 hours.   BUN 35  (H) 6 - 20 mg/dL   Creatinine, Ser 1.19 (H) 0.61 - 1.24 mg/dL   Calcium 8.1 (L) 8.9 - 10.3 mg/dL   GFR, Estimated 34 (L) >60 mL/min    Comment: (NOTE) Calculated using the CKD-EPI Creatinine Equation (2021)    Anion gap 10 5 - 15    Comment: Performed at [facility], [address] [place], [place] [address]  CBC     Status: Abnormal   Collection  Time: 02/03/23 12:51 AM  Result Value Ref Range   WBC 8.4 4.0 - 10.5 K/uL   RBC 3.80 (L) 4.22 - 5.81 MIL/uL   Hemoglobin 11.2 (L) 13.0 - 17.0 g/dL   HCT 16.1 (L) 09.6 - 04.5 %   MCV 85.0 80.0 - 100.0 fL   MCH 29.5 26.0 - 34.0 pg   MCHC 34.7 30.0 - 36.0 g/dL   RDW 40.9 81.1 - 91.4 %   Platelets 179 150 - 400 K/uL   nRBC 0.0 0.0 - 0.2 %    Comment: Performed at [facility], [address] [place], [place] [address]  Glucose, capillary     Status: Abnormal   Collection Time: 02/03/23  8:34 AM  Result Value Ref Range   Glucose-Capillary 101 (H) 70 - 99 mg/dL    Comment: Glucose reference range applies only to samples taken after fasting for at least 8 hours.  Glucose, capillary     Status: Abnormal   Collection Time: 02/03/23  3:37 PM  Result Value Ref Range   Glucose-Capillary 106 (H) 70 - 99 mg/dL    Comment: Glucose reference range applies only to samples taken after fasting for at least 8 hours.  Glucose, capillary     Status: None   Collection Time: 02/03/23  7:30 PM  Result Value Ref Range   Glucose-Capillary 87 70 - 99 mg/dL    Comment: Glucose reference range applies only to samples taken after fasting for at least 8 hours.  Glucose, capillary     Status: None   Collection Time: 02/03/23 11:26 PM  Result Value Ref Range   Glucose-Capillary 96 70 - 99 mg/dL    Comment: Glucose reference range applies only to samples taken after fasting for at least 8 hours.  CBC with Differential/Platelet     Status: Abnormal   Collection Time: 02/04/23 12:40 AM  Result Value Ref Range   WBC 8.2 4.0 - 10.5 K/uL   RBC 3.64 (L)  4.22 - 5.81 MIL/uL   Hemoglobin 10.7 (L) 13.0 - 17.0 g/dL   HCT 62.1 (L) 30.8 - 65.7 %   MCV 83.2 80.0 - 100.0 fL   MCH 29.4 26.0 - 34.0 pg   MCHC 35.3 30.0 - 36.0 g/dL   RDW 84.6 96.2 - 95.2 %   Platelets 205 150 - 400 K/uL   nRBC 0.0 0.0 - 0.2 %   Neutrophils Relative % 70 %   Neutro Abs 5.8 1.7 - 7.7 K/uL   Lymphocytes Relative 16 %   Lymphs Abs 1.3 0.7 - 4.0 K/uL   Monocytes Relative 12 %   Monocytes Absolute 1.0 0.1 - 1.0 K/uL   Eosinophils Relative 1 %   Eosinophils Absolute 0.1 0.0 - 0.5 K/uL   Basophils Relative 0 %   Basophils Absolute 0.0 0.0 - 0.1 K/uL   Immature Granulocytes 1 %   Abs Immature Granulocytes 0.07 0.00 - 0.07 K/uL    Comment: Performed at [facility], [address], [place], [place] [address]  Basic metabolic panel     Status: Abnormal   Collection Time: 02/04/23 12:40 AM  Result Value Ref Range   Sodium 138 135 - 145 mmol/L   Potassium 3.6 3.5 - 5.1 mmol/L   Chloride 105 98 - 111 mmol/L   CO2 22 22 - 32 mmol/L   Glucose, Bld 104 (H) 70 - 99 mg/dL    Comment: Glucose reference range applies only to samples taken after fasting for  at least 8 hours.   BUN 25 (H) 6 - 20 mg/dL   Creatinine, Ser 1.61 (H) 0.61 - 1.24 mg/dL   Calcium 8.4 (L) 8.9 - 10.3 mg/dL   GFR, Estimated 49 (L) >60 mL/min    Comment: (NOTE) Calculated using the CKD-EPI Creatinine Equation (2021)    Anion gap 11 5 - 15    Comment: Performed at [facility], [address] [place], [place] [address]  Magnesium     Status: None   Collection Time: 02/04/23 12:40 AM  Result Value Ref Range   Magnesium 2.2 1.7 - 2.4 mg/dL    Comment: Performed at [facility], [address], [place], [place] [address]  Phosphorus     Status: None   Collection Time: 02/04/23 12:40 AM  Result Value Ref Range   Phosphorus 4.1 2.5 - 4.6 mg/dL    Comment: Performed at [facility], [address], [place], [place] [address]  CK     Status: Abnormal   Collection Time: 02/04/23  12:40 AM  Result Value Ref Range   Total CK 8,718 (H) 49 - 397 U/L    Comment: RESULT CONFIRMED BY MANUAL DILUTION Performed at [facility], [address] [place], [place] [address]   Glucose, capillary     Status: None   Collection Time: 02/04/23  3:31 AM  Result Value Ref Range   Glucose-Capillary 95 70 - 99 mg/dL    Comment: Glucose reference range applies only to samples taken after fasting for at least 8 hours.  Glucose, capillary     Status: Abnormal   Collection Time: 02/04/23  8:47 AM  Result Value Ref Range   Glucose-Capillary 111 (H) 70 - 99 mg/dL    Comment: Glucose reference range applies only to samples taken after fasting for at least 8 hours.  Glucose, capillary     Status: Abnormal   Collection Time: 02/04/23 11:39 AM  Result Value Ref Range   Glucose-Capillary 110 (H) 70 - 99 mg/dL    Comment: Glucose reference range applies only to samples taken after fasting for at least 8 hours.  Glucose, capillary     Status: None   Collection Time: 02/04/23  3:33 PM  Result Value Ref Range   Glucose-Capillary 92 70 - 99 mg/dL    Comment: Glucose reference range applies only to samples taken after fasting for at least 8 hours.  Glucose, capillary     Status: Abnormal   Collection Time: 02/04/23  7:53 PM  Result Value Ref Range   Glucose-Capillary 125 (H) 70 - 99 mg/dL    Comment: Glucose reference range applies only to samples taken after fasting for at least 8 hours.  Glucose, capillary     Status: Abnormal   Collection Time: 02/04/23 11:47 PM  Result Value Ref Range   Glucose-Capillary 103 (H) 70 - 99 mg/dL    Comment: Glucose reference range applies only to samples taken after fasting for at least 8 hours.  CK     Status: Abnormal   Collection Time: 02/05/23  2:25 AM  Result Value Ref Range   Total CK 4,854 (H) 49 - 397 U/L    Comment: RESULT CONFIRMED BY MANUAL DILUTION Performed at [facility], [address] [place], [place] [address]   CBC      Status: Abnormal   Collection Time: 02/05/23  2:25 AM  Result Value Ref Range   WBC 7.5 4.0 -  10.5 K/uL   RBC 3.76 (L) 4.22 - 5.81 MIL/uL   Hemoglobin 11.1 (L) 13.0 - 17.0 g/dL   HCT 40.9 (L) 81.1 - 91.4 %   MCV 84.3 80.0 - 100.0 fL   MCH 29.5 26.0 - 34.0 pg   MCHC 35.0 30.0 - 36.0 g/dL   RDW 78.2 95.6 - 21.3 %   Platelets 247 150 - 400 K/uL   nRBC 0.0 0.0 - 0.2 %    Comment: Performed at [facility], [address], [place], [place] [address]  Basic metabolic panel     Status: Abnormal   Collection Time: 02/05/23  2:25 AM  Result Value Ref Range   Sodium 138 135 - 145 mmol/L   Potassium 3.2 (L) 3.5 - 5.1 mmol/L   Chloride 104 98 - 111 mmol/L   CO2 21 (L) 22 - 32 mmol/L   Glucose, Bld 133 (H) 70 - 99 mg/dL    Comment: Glucose reference range applies only to samples taken after fasting for at least 8 hours.   BUN 20 6 - 20 mg/dL   Creatinine, Ser 8.46 (H) 0.61 - 1.24 mg/dL   Calcium 8.3 (L) 8.9 - 10.3 mg/dL   GFR, Estimated >96 >29 mL/min    Comment: (NOTE) Calculated using the CKD-EPI Creatinine Equation (2021)    Anion gap 13 5 - 15    Comment: Performed at [facility], [address], [place], [place] [address]  Glucose, capillary     Status: Abnormal   Collection Time: 02/05/23  3:55 AM  Result Value Ref Range   Glucose-Capillary 107 (H) 70 - 99 mg/dL    Comment: Glucose reference range applies only to samples taken after fasting for at least 8 hours.  Glucose, capillary     Status: None   Collection Time: 02/05/23  7:50 AM  Result Value Ref Range   Glucose-Capillary 90 70 - 99 mg/dL    Comment: Glucose reference range applies only to samples taken after fasting for at least 8 hours.  Glucose, capillary     Status: None   Collection Time: 02/05/23 11:42 AM  Result Value Ref Range   Glucose-Capillary 93 70 - 99 mg/dL    Comment: Glucose reference range applies only to samples taken after fasting for at least 8 hours.  Lamotrigine level     Status: None    Collection Time: 02/08/23  3:03 PM  Result Value Ref Range   Lamotrigine Lvl 2.6 2.0 - 20.0 ug/mL    Comment:                                 Detection Limit = 1.0  CBC with Differential/Platelets     Status: Abnormal   Collection Time: 02/08/23  3:03 PM  Result Value Ref Range   WBC 9.3 3.4 - 10.8 x10E3/uL   RBC 5.04 4.14 - 5.80 x10E6/uL   Hemoglobin 14.3 13.0 - 17.7 g/dL   Hematocrit 32.4 40.1 - 51.0 %   MCV 85 79 - 97 fL   MCH 28.4 26.6 - 33.0 pg   MCHC 33.5 31.5 - 35.7 g/dL   RDW 02.7 25.3 - 66.4 %   Platelets 447 150 - 450 x10E3/uL   Neutrophils 63 Not Estab. %   Lymphs 22 Not Estab. %   Monocytes 7 Not Estab. %   Eos 2 Not Estab. %   Basos 1 Not Estab. %  Neutrophils Absolute 5.8 1.4 - 7.0 x10E3/uL   Lymphocytes Absolute 2.1 0.7 - 3.1 x10E3/uL   Monocytes Absolute 0.7 0.1 - 0.9 x10E3/uL   EOS (ABSOLUTE) 0.2 0.0 - 0.4 x10E3/uL   Basophils Absolute 0.1 0.0 - 0.2 x10E3/uL   Immature Granulocytes 5 Not Estab. %   Immature Grans (Abs) 0.5 (H) 0.0 - 0.1 x10E3/uL    Comment: (An elevated percentage of Immature Granulocytes has not been found to be clinically significant as a sole clinical predictor of disease. Does NOT include bands or blast cells.  Pregnancy associated physiological leukocytosis may also show increased immature granulocytes without clinical significance.)   Basic Metabolic Panel     Status: Abnormal   Collection Time: 02/08/23  3:03 PM  Result Value Ref Range   Glucose 100 (H) 70 - 99 mg/dL   BUN 11 6 - 20 mg/dL   Creatinine, Ser 1.61 0.76 - 1.27 mg/dL   eGFR 88 >09 UE/AVW/0.98   BUN/Creatinine Ratio 10 9 - 20   Sodium 141 134 - 144 mmol/L   Potassium 4.4 3.5 - 5.2 mmol/L   Chloride 99 96 - 106 mmol/L   CO2 26 20 - 29 mmol/L   Calcium 9.5 8.7 - 10.2 mg/dL        Garner Nash, MD, MS

## 2023-02-10 LAB — URINALYSIS W MICROSCOPIC + REFLEX CULTURE
Bacteria, UA: NONE SEEN /HPF
Bilirubin Urine: NEGATIVE
Glucose, UA: NEGATIVE
Ketones, ur: NEGATIVE
Leukocyte Esterase: NEGATIVE
Nitrites, Initial: NEGATIVE
Protein, ur: NEGATIVE
RBC / HPF: NONE SEEN /HPF (ref 0–2)
Specific Gravity, Urine: 1.013 (ref 1.001–1.035)
Squamous Epithelial / HPF: NONE SEEN /HPF (ref <=5)
WBC, UA: NONE SEEN /HPF (ref 0–5)
pH: 6 (ref 5.0–8.0)

## 2023-02-10 LAB — NO CULTURE INDICATED

## 2023-02-13 ENCOUNTER — Encounter: Payer: Self-pay | Admitting: Neurology

## 2023-02-14 ENCOUNTER — Telehealth: Payer: Self-pay

## 2023-02-14 ENCOUNTER — Other Ambulatory Visit: Payer: Self-pay | Admitting: Neurology

## 2023-02-14 DIAGNOSIS — Z5181 Encounter for therapeutic drug level monitoring: Secondary | ICD-10-CM

## 2023-02-14 NOTE — Telephone Encounter (Signed)
Incoming fax from [place] [employer] requesting complete the provider statement for disability benefits and patient's inability to return to work. Paperwork placed in your inbasket for review. LOV: 02/09/2023. Please advise.

## 2023-02-15 NOTE — Telephone Encounter (Signed)
Form on Yolanda's desk 

## 2023-02-20 NOTE — Telephone Encounter (Signed)
Pt returning call, please c/b

## 2023-02-20 NOTE — Telephone Encounter (Signed)
Left patient a detailed voice message to return call to office regarding [employer] paperwork received via fax.

## 2023-02-21 NOTE — Telephone Encounter (Signed)
Contacted Latosha with [employer] to advise her that we received a fax that requested attachment to be completed, but we didn't receive the attachment. She stated that they will refax it within the next hour.

## 2023-02-22 NOTE — Telephone Encounter (Signed)
Spoke with patient and he stated that the form is for verification that he was hospitalized and was seen by all his providers. I advised him once we receive the updated form I will forward it to Dr. Janee Morn for review.

## 2023-03-28 ENCOUNTER — Institutional Professional Consult (permissible substitution): Payer: 59 | Admitting: Neurology

## 2023-03-29 ENCOUNTER — Ambulatory Visit (INDEPENDENT_AMBULATORY_CARE_PROVIDER_SITE_OTHER): Payer: 59 | Admitting: Neurology

## 2023-03-29 ENCOUNTER — Encounter: Payer: Self-pay | Admitting: Neurology

## 2023-03-29 VITALS — BP 117/67 | HR 76 | Ht 75.0 in | Wt 224.0 lb

## 2023-03-29 DIAGNOSIS — E663 Overweight: Secondary | ICD-10-CM

## 2023-03-29 DIAGNOSIS — Z720 Tobacco use: Secondary | ICD-10-CM

## 2023-03-29 DIAGNOSIS — G40009 Localization-related (focal) (partial) idiopathic epilepsy and epileptic syndromes with seizures of localized onset, not intractable, without status epilepticus: Secondary | ICD-10-CM

## 2023-03-29 DIAGNOSIS — G40209 Localization-related (focal) (partial) symptomatic epilepsy and epileptic syndromes with complex partial seizures, not intractable, without status epilepticus: Secondary | ICD-10-CM

## 2023-03-29 DIAGNOSIS — R0689 Other abnormalities of breathing: Secondary | ICD-10-CM

## 2023-03-29 DIAGNOSIS — Z5181 Encounter for therapeutic drug level monitoring: Secondary | ICD-10-CM

## 2023-03-29 DIAGNOSIS — Z9189 Other specified personal risk factors, not elsewhere classified: Secondary | ICD-10-CM | POA: Diagnosis not present

## 2023-03-29 DIAGNOSIS — R0683 Snoring: Secondary | ICD-10-CM

## 2023-03-29 NOTE — Progress Notes (Signed)
Subjective:    Patient ID: Joseph Cameron is a 32 y.o. male.  HPI    Huston Foley, MD, PhD [facility] [address], [address] [address] [place], [place] [address]  Dear Joseph Cameron,   I saw your patient, Joseph Cameron, upon your kind request, in my sleep clinic today for initial consultation of his sleep disorder, in particular, concern for underlying obstructive sleep apnea.  The patient is unaccompanied today.  As you know, Mr. Joseph Cameron is a 32 year old male with an underlying medical history of syncope, partial complex seizures, and overweight state, who reports snoring and some daytime tiredness.  He does not always wake up rested.  He has woken up occasionally with a sense of gasping for air.  He lives with his wife, they have no children.  They have 2 dogs and 1 cat in the household, sometimes the pets sleep in their bedroom with them.  He does have a TV on in the bedroom at night and has a tendency to leave it on, the TV may go off on a timer at times.  He does not smoke cigarettes but smokes a hookah about once a week.  He drinks alcohol infrequently, maybe once a month.  He drinks caffeine in the form of coffee, 2 large cups in the mornings.  His bedtime varies and rise time varies, usually in bed by 10 or 11 PM and rise time around 7 AM.  He works as a Teacher, early years/pre.  He is currently not driving as he had a seizure in April.  He denies nocturia or recurrent nocturnal or morning headaches, is not aware of any family history of sleep apnea.  His Past Medical History Is Significant For: Past Medical History:  Diagnosis Date   Acute tonsillitis 04/07/2013   GERD (gastroesophageal reflux disease)    Seizures (HCC)    SYNCOPE, VASOVAGAL 07/05/2009   Qualifier: Diagnosis of   By: Amador Cunas  MD, Janett Labella       His Past Surgical History Is Significant For: Past Surgical History:  Procedure Laterality Date   TONSILLECTOMY      His Family History Is  Significant For: Family History  Problem Relation Age of Onset   Hyperlipidemia Mother    Diabetes Mother    Asthma Mother    Hypertension Father    Depression Sister    ADD / ADHD Brother    Depression Brother    Diabetes Maternal Grandmother    Leukemia Maternal Grandmother    Obesity Maternal Grandmother    Diabetes Maternal Grandfather    Hypertension Paternal Grandmother    Hypertension Paternal Grandfather    Seizures Maternal Aunt     His Social History Is Significant For: Social History   Socioeconomic History   Marital status: Married    Spouse name: Not on file   Number of children: Not on file   Years of education: Not on file   Highest education level: Doctorate  Occupational History   Not on file  Tobacco Use   Smoking status: Former    Types: Cigarettes   Smokeless tobacco: Never   Tobacco comments:    Smokes a hookah maybe once a week, none in home  Vaping Use   Vaping Use: Some days  Substance and Sexual Activity   Alcohol use: Not Currently    Alcohol/week: 1.0 standard drink of alcohol    Types: 1 Standard drinks or equivalent per week    Comment: Maybe once a month  or less   Drug use: Not Currently   Sexual activity: Yes    Birth control/protection: None  Other Topics Concern   Not on file  Social History Narrative   Caffeine: 3 cups/day on working days, otherwise some days none    Social Determinants of Health   Financial Resource Strain: Low Risk  (02/08/2023)   Overall Financial Resource Strain (CARDIA)    Difficulty of Paying Living Expenses: Not hard at all  Food Insecurity: No Food Insecurity (02/08/2023)   Hunger Vital Sign    Worried About Running Out of Food in the Last Year: Never true    Ran Out of Food in the Last Year: Never true  Transportation Needs: No Transportation Needs (02/08/2023)   PRAPARE - Administrator, Civil Service (Medical): No    Lack of Transportation (Non-Medical): No  Physical Activity: Unknown  (02/08/2023)   Exercise Vital Sign    Days of Exercise per Week: Patient declined    Minutes of Exercise per Session: Not on file  Stress: Stress Concern Present (02/08/2023)   [employer] of Occupational Health - Occupational Stress Questionnaire    Feeling of Stress : To some extent  Social Connections: Moderately Isolated (02/08/2023)   Social Connection and Isolation Panel [NHANES]    Frequency of Communication with Friends and Family: More than three times a week    Frequency of Social Gatherings with Friends and Family: More than three times a week    Attends Religious Services: Never    Database administrator or Organizations: No    Attends Engineer, structural: Not on file    Marital Status: Married    His Allergies Are:  No Known Allergies:   His Current Medications Are:  Outpatient Encounter Medications as of 03/29/2023  Medication Sig   acetaminophen (TYLENOL) 325 MG tablet Take 650 mg by mouth every 6 (six) hours as needed for moderate pain. Patient reports 2-3 times a day   diazePAM, 15 MG Dose, (VALTOCO 15 MG DOSE) 2 x 7.5 MG/0.1ML LQPK Place 7.5 mg into the nose as needed (For seizure lasting more than 2 minutes).   LamoTRIgine 300 MG TB24 24 hour tablet Take 1 tablet (300 mg total) by mouth daily.   potassium chloride SA (KLOR-CON M) 20 MEQ tablet Take 1 tablet (20 mEq total) by mouth daily for 5 days.   No facility-administered encounter medications on file as of 03/29/2023.  :   Review of Systems:  Out of a complete 14 point review of systems, all are reviewed and negative with the exception of these symptoms as listed below:  Review of Systems  Neurological:        Patient is here alone for sleep consult. He was referred by Dr Teresa Coombs for loud snoring, apneic episodes at night, and waking up catching his breath. Patient states he has never had a sleep study. He does not know specifically if he has any family members who have been diagnosed with OSA. He  states that most every seizure he has had has been while he's asleep. 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 ESS 9 FSS 30    Objective:  Neurological Exam  Physical Exam Physical Examination:   Vitals:   03/29/23 1422  BP: 117/67  Pulse: 76    General Examination: The patient is a very pleasant 32 y.o. male in no acute distress. He appears well-developed and well-nourished and well groomed. He appears  sleepy during this appointment.   HEENT: Normocephalic, atraumatic, pupils are equal, round and reactive to light, extraocular tracking is good without limitation to gaze excursion or nystagmus noted. Hearing is grossly intact. Face is symmetric with normal facial animation. Speech is clear with no dysarthria noted. There is no hypophonia. There is no lip, neck/head, jaw or voice tremor. Neck is supple with full range of passive and active motion. There are no carotid bruits on auscultation. Oropharynx exam reveals: mild mouth dryness, adequate dental hygiene and moderate airway crowding, due to small airway entry and thicker soft palate, patient reports a tonsillectomy but appears to have tonsillar tissue bilaterally, 1-2+ tonsils with some cryptic appearance.  Mallampati class III.  Neck circumference 16-5/8 inches.  He does not have much in the way of overbite, slight crossbite.  Tongue protrudes centrally and palate elevates symmetrically.  Chest: Clear to auscultation without wheezing, rhonchi or crackles noted.  Heart: S1+S2+0, regular and normal without murmurs, rubs or gallops noted.   Abdomen: Soft, non-tender and non-distended.  Extremities: There is no pitting edema in the distal lower extremities bilaterally.   Skin: Warm and dry without trophic changes noted.   Musculoskeletal: exam reveals no obvious joint deformities.   Neurologically:  Mental status: The patient is awake, alert and oriented in all 4 spheres. His immediate and remote memory,  attention, language skills and fund of knowledge are appropriate. There is no evidence of aphasia, agnosia, apraxia or anomia. Speech is clear with normal prosody and enunciation. Thought process is linear. Mood is normal and affect is normal.  Cranial nerves II - XII are as described above under HEENT exam.  Motor exam: Normal bulk, strength and tone is noted. There is no obvious action or resting tremor.  Fine motor skills and coordination: grossly intact.  Cerebellar testing: No dysmetria or intention tremor. There is no truncal or gait ataxia.  Sensory exam: intact to light touch in the upper and lower extremities.  Gait, station and balance: He stands easily. No veering to one side is noted. No leaning to one side is noted. Posture is age-appropriate and stance is narrow based. Gait shows normal stride length and normal pace. No problems turning are noted.   Assessment and Plan:  In summary, DONTRELL PULLAR is a very pleasant 32 y.o.-year old male with an underlying medical history of syncope, partial complex seizures, and overweight state, whose history and physical exam are concerning for sleep disordered breathing, supporting a current working diagnosis of unspecified sleep apnea, with the main differential diagnoses of obstructive sleep apnea (OSA) versus upper airway resistance syndrome (UARS) versus central sleep apnea (CSA), or mixed sleep apnea. While a laboratory attended sleep study is typically considered "gold standard" for evaluation of sleep disordered breathing, he would prefer a home sleep test.   I had a long chat with the patient about my findings and the diagnosis of sleep apnea, particularly OSA, its prognosis and treatment options. We talked about medical/conservative treatments, surgical interventions and non-pharmacological approaches for symptom control. I explained, in particular, the risks and ramifications of untreated moderate to severe OSA, especially with respect to  developing cardiovascular disease down the road, including congestive heart failure (CHF), difficult to treat hypertension, cardiac arrhythmias (particularly A-fib), neurovascular complications including TIA, stroke and dementia. Even type 2 diabetes has, in part, been linked to untreated OSA. Symptoms of untreated OSA may include (but may not be limited to) daytime sleepiness, nocturia (i.e. frequent nighttime urination), memory problems, mood irritability  and suboptimally controlled or worsening mood disorder such as depression and/or anxiety, lack of energy, lack of motivation, physical discomfort, as well as recurrent headaches, especially morning or nocturnal headaches. We talked about the importance of maintaining a healthy lifestyle and striving for healthy weight. In addition, we talked about the importance of striving for and maintaining good sleep hygiene. I recommended a sleep study at this time. I outlined the differences between a laboratory attended sleep study which is considered more comprehensive and accurate over the option of a home sleep test (HST); the latter may lead to underestimation of sleep disordered breathing in some instances and does not help with diagnosing upper airway resistance syndrome and is not accurate enough to diagnose primary central sleep apnea typically. I outlined possible surgical and non-surgical treatment options of OSA, including the use of a positive airway pressure (PAP) device (i.e. CPAP, AutoPAP/APAP or BiPAP in certain circumstances), a custom-made dental device (aka oral appliance, which would require a referral to a specialist dentist or orthodontist typically, and is generally speaking not considered for patients with full dentures or edentulous state), upper airway surgical options, such as traditional UPPP (which is not considered a first-line treatment) or the Inspire device (hypoglossal nerve stimulator, which would involve a referral for consultation  with an ENT surgeon, after careful selection, following inclusion criteria - also not first-line treatment). I explained the PAP treatment option to the patient in detail, as this is generally considered first-line treatment.  The patient indicated that he would be willing to try PAP therapy, if the need arises. I explained the importance of being compliant with PAP treatment, not only for insurance purposes but primarily to improve patient's symptoms symptoms, and for the patient's long term health benefit, including to reduce His cardiovascular risks longer-term.    We will pick up our discussion about the next steps and treatment options after testing.  We will keep him posted as to the test results by phone call and/or MyChart messaging where possible.  We will plan to follow-up in sleep clinic accordingly as well.  I answered all his questions today and the patient was in agreement.   I encouraged him to call with any interim questions, concerns, problems or updates or email [place] through MyChart.  Generally speaking, sleep test authorizations may take up to 2 weeks, sometimes less, sometimes longer, the patient is encouraged to get in touch with [place] if they do not hear back from the sleep lab staff directly within the next 2 weeks.  Thank you very much for allowing me to participate in the care of this nice patient. If I can be of any further assistance to you please do not hesitate to talk to me.   Sincerely,   Huston Foley, MD, PhD

## 2023-03-29 NOTE — Patient Instructions (Signed)

## 2023-03-30 LAB — BASIC METABOLIC PANEL
BUN/Creatinine Ratio: 17 (ref 9–20)
BUN: 15 mg/dL (ref 6–20)
CO2: 25 mmol/L (ref 20–29)
Calcium: 9.5 mg/dL (ref 8.7–10.2)
Chloride: 100 mmol/L (ref 96–106)
Creatinine, Ser: 0.88 mg/dL (ref 0.76–1.27)
Glucose: 79 mg/dL (ref 70–99)
Potassium: 4.5 mmol/L (ref 3.5–5.2)
Sodium: 140 mmol/L (ref 134–144)
eGFR: 118 mL/min/{1.73_m2} (ref >59)

## 2023-03-30 LAB — LAMOTRIGINE LEVEL: Lamotrigine Lvl: 3.1 ug/mL (ref 2.0–20.0)

## 2023-05-03 ENCOUNTER — Telehealth: Payer: Self-pay | Admitting: Neurology

## 2023-05-03 NOTE — Telephone Encounter (Signed)
04/26/23 LVM was left  04/10/23 LVM [place] 04/02/23 [employer] no auth req EE

## 2023-07-10 ENCOUNTER — Ambulatory Visit (INDEPENDENT_AMBULATORY_CARE_PROVIDER_SITE_OTHER): Payer: 59 | Admitting: Neurology

## 2023-07-10 ENCOUNTER — Encounter: Payer: Self-pay | Admitting: Neurology

## 2023-07-10 VITALS — BP 130/82 | Ht 75.0 in | Wt 238.0 lb

## 2023-07-10 DIAGNOSIS — G40009 Localization-related (focal) (partial) idiopathic epilepsy and epileptic syndromes with seizures of localized onset, not intractable, without status epilepticus: Secondary | ICD-10-CM

## 2023-07-10 NOTE — Progress Notes (Signed)
[facility]  PATIENT: Joseph Cameron DOB: [birth year]  REQUESTING CLINICIAN: Garnette Gunner, MD HISTORY FROM: Patient  REASON FOR VISIT: Epilepsy    HISTORICAL  CHIEF COMPLAINT:  Chief Complaint  Patient presents with   Follow-up    Rm 13, Sz f/u no sz, no missed medication doses, last sz 01/30/23, denies SI/HI   INTERVAL HISTORY 07/10/2023:  Patient presents today for follow-up, he is accompanied by wife.  Last visit was in April and since then, he has been doing well, no seizure or seizure like activity.  He is compliant with the lamotrigine XR 300 mg daily, denies any side effect from the medication.  His last level was normal at 3.1.  No other complaints, no other concerns. He did follow up with Dr. Frances Furbish for sleep but has decided not to pursue the sleep study.    INTERVAL HISTORY 02/08/2023:  Patient presents today for follow-up, he is accompanied by wife.  Last visit was in January, at that time he did not have any seizures so plan was to continue with lamotrigine XR 150 mg daily.  Unfortunately on April 16 he did have a breakthrough seizure.  He reported compliance with his medication but there was possible sleep deprivation.  He had a cluster of seizures, was intubated and put on burst suppression.  He was also loaded on Keppra.  On discharge his lamotrigine was increased to 100 mg twice daily. He went from XR formulation to a immediate release and since then has been having complaint of hot sweats, difficulty with sleep, and restlessness.  She does also complain of some headaches.  Reported he is feeling better day by day. Again he could not tell me any provoking factor except then being under a lot of stress stress and sleep deprivation. Another complaint brought on by wife wife is loud snoring and apneic period and gasping for air during sleep. He feels sometime in the morning when he wakes up he does not feel rested, he does not report sleeping while  driving but stated that he will fall asleep while reading.  He would like to be evaluated for sleep apnea   INTERVAL HISTORY 10/26/2022:  Patient presents today for follow-up, he is accompanied by her mother.  Since last visit in October, he has not had any additional seizures.  He is compliant with the lamotrigine XR 150 mg daily, denies any side effect and no other complaints.  He reported in the last week of December he was experiencing chest pain.  He presented to the ED all his workup has been negative.   HISTORY OF PRESENT ILLNESS:  This is a 32 year old gentleman past medical history of ADHD and epilepsy who is presenting to establish care.  Patient reported his first seizure happened at the age of 30, it was a nocturnal seizure.  At that time, he was not put on anti-seizure medication.  Then 2 years later he had another seizure, again nocturnal and was put on lamotrigine.  He was doing well on lamotrigine 150 mg extended release nightly, no seizure for the next 6 years.  He self discontinued the medication, started having episodes of severe nausea for about 30 seconds and actually ended up having a breakthrough seizure.  He restarted the medication and was doing well for next 5 years, then he self discontinued the medication and then 2 years later on July 31 2022 he had another generalized seizure while at work. He reports having a feeling of  severe nausea then woke up on the floor. He was taken to the ED, initial work up unrevealing, his CT head was within normal limits.  With the seizures, he had tongue biting, fall and bruises.  Denies any other type of seizures, denies myoclonic jerks, and denies episode of staring spells.   Handedness: Right handed   Onset: At the age of 32  Seizure Type: Generalized   Current frequency: A total of 6, last one 01/29/2022  Any injuries from seizures: Tongue bite, bruises,   Seizure risk factors: Aunt, uncle from father side, history of head injury    Previous ASMs: Lamotrigine   Currenty ASMs: Lamotrigine XR 300 mg daily   ASMs side effects: None   Brain Images: Normal head CT   Previous EEGs: unavailable for review    OTHER MEDICAL CONDITIONS: Epilepsy   REVIEW OF SYSTEMS: Full 14 system review of systems performed and negative with exception of: As noted in the HPI   ALLERGIES: No Known Allergies  HOME MEDICATIONS: Outpatient Medications Prior to Visit  Medication Sig Dispense Refill   diazePAM, 15 MG Dose, (VALTOCO 15 MG DOSE) 2 x 7.5 MG/0.1ML LQPK Place 7.5 mg into the nose as needed (For seizure lasting more than 2 minutes). 5 each 5   LamoTRIgine 300 MG TB24 24 hour tablet Take 1 tablet (300 mg total) by mouth daily. 30 tablet 11   acetaminophen (TYLENOL) 325 MG tablet Take 650 mg by mouth every 6 (six) hours as needed for moderate pain. Patient reports 2-3 times a day (Patient not taking: Reported on 07/10/2023)     potassium chloride SA (KLOR-CON M) 20 MEQ tablet Take 1 tablet (20 mEq total) by mouth daily for 5 days. 5 tablet 0   No facility-administered medications prior to visit.    PAST MEDICAL HISTORY: Past Medical History:  Diagnosis Date   Acute tonsillitis 04/07/2013   GERD (gastroesophageal reflux disease)    Seizures (HCC)    SYNCOPE, VASOVAGAL 07/05/2009   Qualifier: Diagnosis of   By: Amador Cunas  MD, Janett Labella       PAST SURGICAL HISTORY: Past Surgical History:  Procedure Laterality Date   TONSILLECTOMY      FAMILY HISTORY: Family History  Problem Relation Age of Onset   Hyperlipidemia Mother    Diabetes Mother    Asthma Mother    Hypertension Father    Depression Sister    ADD / ADHD Brother    Depression Brother    Diabetes Maternal Grandmother    Leukemia Maternal Grandmother    Obesity Maternal Grandmother    Diabetes Maternal Grandfather    Hypertension Paternal Grandmother    Hypertension Paternal Grandfather    Seizures Maternal Aunt     SOCIAL HISTORY: Social History    Socioeconomic History   Marital status: Married    Spouse name: Not on file   Number of children: Not on file   Years of education: Not on file   Highest education level: Doctorate  Occupational History   Not on file  Tobacco Use   Smoking status: Former    Types: Cigarettes   Smokeless tobacco: Never   Tobacco comments:    Smokes a hookah maybe once a week, none in home  Vaping Use   Vaping status: Some Days  Substance and Sexual Activity   Alcohol use: Not Currently    Alcohol/week: 1.0 standard drink of alcohol    Types: 1 Standard drinks or equivalent per week  Comment: Maybe once a month or less   Drug use: Not Currently   Sexual activity: Yes    Birth control/protection: None  Other Topics Concern   Not on file  Social History Narrative   Caffeine: 3 cups/day on working days, otherwise some days none    Right handed   Lives with wife   Social Determinants of Health   Financial Resource Strain: Low Risk  (02/08/2023)   Overall Financial Resource Strain (CARDIA)    Difficulty of Paying Living Expenses: Not hard at all  Food Insecurity: No Food Insecurity (02/08/2023)   Hunger Vital Sign    Worried About Running Out of Food in the Last Year: Never true    Ran Out of Food in the Last Year: Never true  Transportation Needs: No Transportation Needs (02/08/2023)   PRAPARE - Administrator, Civil Service (Medical): No    Lack of Transportation (Non-Medical): No  Physical Activity: Unknown (02/08/2023)   Exercise Vital Sign    Days of Exercise per Week: Patient declined    Minutes of Exercise per Session: Not on file  Stress: Stress Concern Present (02/08/2023)   [employer] of Occupational Health - Occupational Stress Questionnaire    Feeling of Stress : To some extent  Social Connections: Moderately Isolated (02/08/2023)   Social Connection and Isolation Panel [NHANES]    Frequency of Communication with Friends and Family: More than three times a  week    Frequency of Social Gatherings with Friends and Family: More than three times a week    Attends Religious Services: Never    Database administrator or Organizations: No    Attends Engineer, structural: Not on file    Marital Status: Married  Catering manager Violence: Low Risk  (06/27/2020)   Received from [facility]   Intimate Partner Violence    Insults You: Not on file    Threatens You: Not on file    Screams at [place]: Not on file    Physically Hurt: Not on file    Intimate Partner Violence Score: Not on file    PHYSICAL EXAM  GENERAL EXAM/CONSTITUTIONAL: Vitals:  Vitals:   07/10/23 1350  BP: 130/82  Weight: 238 lb (108 kg)  Height: 6\' 3"  (1.905 m)   Body mass index is 29.75 kg/m. Wt Readings from Last 3 Encounters:  07/10/23 238 lb (108 kg)  03/29/23 224 lb (101.6 kg)  02/09/23 216 lb 6.4 oz (98.2 kg)   Patient is in no distress; well developed, nourished and groomed; neck is supple  MUSCULOSKELETAL: Gait, strength, tone, movements noted in Neurologic exam below  NEUROLOGIC: MENTAL STATUS:      No data to display         awake, alert, oriented to person, place and time recent and remote memory intact normal attention and concentration language fluent, comprehension intact, naming intact fund of knowledge appropriate  CRANIAL NERVE:  2nd, 3rd, 4th, 6th - visual fields full to confrontation, extraocular muscles intact, no nystagmus 5th - facial sensation symmetric 7th - facial strength symmetric 8th - hearing intact 9th - palate elevates symmetrically, uvula midline 11th - shoulder shrug symmetric 12th - tongue protrusion midline  MOTOR:  normal bulk and tone, full strength in the BUE, BLE  SENSORY:  normal and symmetric to light touch  COORDINATION:  finger-nose-finger, fine finger movements normal  GAIT/STATION:  normal   DIAGNOSTIC DATA (LABS, IMAGING, TESTING) - I reviewed patient records, labs, notes, testing  and  imaging myself where available.  Lab Results  Component Value Date   WBC 9.3 02/08/2023   HGB 14.3 02/08/2023   HCT 42.7 02/08/2023   MCV 85 02/08/2023   PLT 447 02/08/2023      Component Value Date/Time   NA 140 03/29/2023 1538   K 4.5 03/29/2023 1538   CL 100 03/29/2023 1538   CO2 25 03/29/2023 1538   GLUCOSE 79 03/29/2023 1538   GLUCOSE 133 (H) 02/05/2023 0225   BUN 15 03/29/2023 1538   CREATININE 0.88 03/29/2023 1538   CALCIUM 9.5 03/29/2023 1538   PROT 8.7 (H) 01/30/2023 1047   ALBUMIN 5.0 01/30/2023 1047   AST 52 (H) 01/30/2023 1047   ALT 31 01/30/2023 1047   ALKPHOS 54 01/30/2023 1047   BILITOT 0.9 01/30/2023 1047   GFRNONAA >60 02/05/2023 0225   GFRAA  02/07/2010 0932    NOT CALCULATED        The eGFR has been calculated using the MDRD equation. This calculation has not been validated in all clinical situations. eGFR's persistently <60 mL/min signify possible Chronic Kidney Disease.   Lab Results  Component Value Date   CHOL 189 11/06/2022   HDL 47.20 11/06/2022   LDLCALC 110 (H) 11/06/2022   TRIG 269 (H) 01/31/2023   Lab Results  Component Value Date   HGBA1C 5.2 11/06/2022   No results found for: "VITAMINB12" Lab Results  Component Value Date   TSH 3.10 11/06/2022    Head CT 07/31/22 Normal head CT   Routine EEG 08/22/2022 Normal    I personally reviewed brain Images.  ASSESSMENT AND PLAN  32 y.o. year old male  with history of epilepsy who is presenting for follow up.  He is doing well on lamotrigine XR 300 mg daily his last seizure was on January 30, 2023.  Plan will be to continue patient on the current dose of lamotrigine, his last level was normal at 3.1.  I will see him in follow-up in 1 year or sooner if worse.  He voiced understanding.   1. Partial idiopathic epilepsy with seizures of localized onset, not intractable, without status epilepticus [facility])      Patient Instructions  Continue with lamotrigine XR 300 mg  daily Continue your other medications Can resume driving by October 15 if no additional seizures Follow-up in 1 year or sooner if worse.   Please contact us if you do have a breakthrough seizure.   Per [facility] statutes, patients with seizures are not allowed to drive until they have been seizure-free for six months.  Other recommendations include using caution when using heavy equipment or power tools. Avoid working on ladders or at heights. Take showers instead of baths.  Do not swim alone.  Ensure the water temperature is not too high on the home water heater. Do not go swimming alone. Do not lock yourself in a room alone (i.e. bathroom). When caring for infants or small children, sit down when holding, feeding, or changing them to minimize risk of injury to the child in the event you have a seizure. Maintain good sleep hygiene. Avoid alcohol.  Also recommend adequate sleep, hydration, good diet and minimize stress.   During the Seizure  - First, ensure adequate ventilation and place patients on the floor on their left side  Loosen clothing around the neck and ensure the airway is patent. If the patient is clenching the teeth, do not force the mouth open with any object as this  can cause severe damage - Remove all items from the surrounding that can be hazardous. The patient may be oblivious to what's happening and may not even know what he or she is doing. If the patient is confused and wandering, either gently guide him/her away and block access to outside areas - Reassure the individual and be comforting - Call 911. In most cases, the seizure ends before EMS arrives. However, there are cases when seizures may last over 3 to 5 minutes. Or the individual may have developed breathing difficulties or severe injuries. If a pregnant patient or a person with diabetes develops a seizure, it is prudent to call an ambulance. - Finally, if the patient does not regain full consciousness, then  call EMS. Most patients will remain confused for about 45 to 90 minutes after a seizure, so you must use judgment in calling for help. - Avoid restraints but make sure the patient is in a bed with padded side rails - Place the individual in a lateral position with the neck slightly flexed; this will help the saliva drain from the mouth and prevent the tongue from falling backward - Remove all nearby furniture and other hazards from the area - Provide verbal assurance as the individual is regaining consciousness - Provide the patient with privacy if possible - Call for help and start treatment as ordered by the caregiver   After the Seizure (Postictal Stage)  After a seizure, most patients experience confusion, fatigue, muscle pain and/or a headache. Thus, one should permit the individual to sleep. For the next few days, reassurance is essential. Being calm and helping reorient the person is also of importance.  Most seizures are painless and end spontaneously. Seizures are not harmful to others but can lead to complications such as stress on the lungs, brain and the heart. Individuals with prior lung problems may develop labored breathing and respiratory distress.     No orders of the defined types were placed in this encounter.   No orders of the defined types were placed in this encounter.   Return in about 1 year (around 07/09/2024).    Windell Norfolk, MD 07/10/2023, 2:14 PM  [facility] [address], [address] [place], [place] [address] [phone]

## 2023-07-10 NOTE — Patient Instructions (Addendum)
Continue with lamotrigine XR 300 mg daily Continue your other medications Can resume driving by October 15 if no additional seizures Follow-up in 1 year or sooner if worse.   Please contact us if you do have a breakthrough seizure.

## 2023-10-25 ENCOUNTER — Encounter: Payer: Self-pay | Admitting: Neurology

## 2023-10-25 ENCOUNTER — Ambulatory Visit (INDEPENDENT_AMBULATORY_CARE_PROVIDER_SITE_OTHER): Payer: 59 | Admitting: Neurology

## 2023-10-25 VITALS — BP 125/73 | HR 62 | Ht 75.0 in | Wt 250.0 lb

## 2023-10-25 DIAGNOSIS — G40009 Localization-related (focal) (partial) idiopathic epilepsy and epileptic syndromes with seizures of localized onset, not intractable, without status epilepticus: Secondary | ICD-10-CM

## 2023-10-25 MED ORDER — LAMOTRIGINE ER 300 MG PO TB24: 300.0000 mg | ORAL_TABLET | Freq: Every day | ORAL | 4 refills | Status: DC

## 2023-10-25 NOTE — Progress Notes (Signed)
 [facility]  PATIENT: Joseph Cameron DOB: [birth year]  REQUESTING CLINICIAN: No ref. provider found HISTORY FROM: Patient  REASON FOR VISIT: Epilepsy    HISTORICAL  CHIEF COMPLAINT:  Chief Complaint  Patient presents with   Seizures    Rm13, alone, Ds:ojdu episode April 2024, doing well no concerns   INTERVAL HISTORY 10/25/2023:  Patient presents today for follow-up, last visit was in September, since then he has been doing well, denies any seizure or seizure like activity.  He is compliant with his lamotrigine  XR 300 mg daily; no other complaints, no other concerns.   INTERVAL HISTORY 07/10/2023:  Patient presents today for follow-up, he is accompanied by wife.  Last visit was in April and since then, he has been doing well, no seizure or seizure like activity.  He is compliant with the lamotrigine  XR 300 mg daily, denies any side effect from the medication.  His last level was normal at 3.1.  No other complaints, no other concerns. He did follow up with Dr. Buck for sleep but has decided not to pursue the sleep study.    INTERVAL HISTORY 02/08/2023:  Patient presents today for follow-up, he is accompanied by wife.  Last visit was in January, at that time he did not have any seizures so plan was to continue with lamotrigine  XR 150 mg daily.  Unfortunately on April 16 he did have a breakthrough seizure.  He reported compliance with his medication but there was possible sleep deprivation.  He had a cluster of seizures, was intubated and put on burst suppression.  He was also loaded on Keppra .  On discharge his lamotrigine  was increased to 100 mg twice daily. He went from XR formulation to a immediate release and since then has been having complaint of hot sweats, difficulty with sleep, and restlessness.  She does also complain of some headaches.  Reported he is feeling better day by day. Again he could not tell me any provoking factor except then being under a lot of  stress stress and sleep deprivation. Another complaint brought on by wife wife is loud snoring and apneic period and gasping for air during sleep. He feels sometime in the morning when he wakes up he does not feel rested, he does not report sleeping while driving but stated that he will fall asleep while reading.  He would like to be evaluated for sleep apnea   INTERVAL HISTORY 10/26/2022:  Patient presents today for follow-up, he is accompanied by her mother.  Since last visit in October, he has not had any additional seizures.  He is compliant with the lamotrigine  XR 150 mg daily, denies any side effect and no other complaints.  He reported in the last week of December he was experiencing chest pain.  He presented to the ED all his workup has been negative.   HISTORY OF PRESENT ILLNESS:  This is a 33 year old gentleman past medical history of ADHD and epilepsy who is presenting to establish care.  Patient reported his first seizure happened at the age of 33, it was a nocturnal seizure.  At that time, he was not put on anti-seizure medication.  Then 2 years later he had another seizure, again nocturnal and was put on lamotrigine .  He was doing well on lamotrigine  150 mg extended release nightly, no seizure for the next 6 years.  He self discontinued the medication, started having episodes of severe nausea for about 30 seconds and actually ended up having a breakthrough seizure.  He restarted the medication and was doing well for next 5 years, then he self discontinued the medication and then 2 years later on July 31 2022 he had another generalized seizure while at work. He reports having a feeling of severe nausea then woke up on the floor. He was taken to the ED, initial work up unrevealing, his CT head was within normal limits.  With the seizures, he had tongue biting, fall and bruises.  Denies any other type of seizures, denies myoclonic jerks, and denies episode of staring spells.   Handedness:  Right handed   Onset: At the age of 33  Seizure Type: Generalized   Current frequency: A total of 6, last one 01/30/2023  Any injuries from seizures: Tongue bite, bruises,   Seizure risk factors: Aunt, uncle from father side, history of head injury   Previous ASMs: Lamotrigine    Currenty ASMs: Lamotrigine  XR 300 mg daily   ASMs side effects: None   Brain Images: Normal head CT   Previous EEGs: unavailable for review    OTHER MEDICAL CONDITIONS: Epilepsy   REVIEW OF SYSTEMS: Full 14 system review of systems performed and negative with exception of: As noted in the HPI   ALLERGIES: No Known Allergies  HOME MEDICATIONS: Outpatient Medications Prior to Visit  Medication Sig Dispense Refill   diazePAM , 15 MG Dose, (VALTOCO  15 MG DOSE) 2 x 7.5 MG/0.1ML LQPK Place 7.5 mg into the nose as needed (For seizure lasting more than 2 minutes). 5 each 5   LamoTRIgine  300 MG TB24 24 hour tablet Take 1 tablet (300 mg total) by mouth daily. 30 tablet 11   No facility-administered medications prior to visit.    PAST MEDICAL HISTORY: Past Medical History:  Diagnosis Date   Acute tonsillitis 04/07/2013   GERD (gastroesophageal reflux disease)    Seizures (HCC)    SYNCOPE, VASOVAGAL 07/05/2009   Qualifier: Diagnosis of   By: Jame  MD, Maude FALCON       PAST SURGICAL HISTORY: Past Surgical History:  Procedure Laterality Date   TONSILLECTOMY      FAMILY HISTORY: Family History  Problem Relation Age of Onset   Hyperlipidemia Mother    Diabetes Mother    Asthma Mother    Hypertension Father    Depression Sister    ADD / ADHD Brother    Depression Brother    Diabetes Maternal Grandmother    Leukemia Maternal Grandmother    Obesity Maternal Grandmother    Diabetes Maternal Grandfather    Hypertension Paternal Grandmother    Hypertension Paternal Grandfather    Seizures Maternal Aunt     SOCIAL HISTORY: Social History   Socioeconomic History   Marital status:  Married    Spouse name: Not on file   Number of children: Not on file   Years of education: Not on file   Highest education level: Doctorate  Occupational History   Not on file  Tobacco Use   Smoking status: Former    Types: Cigarettes   Smokeless tobacco: Never   Tobacco comments:    Smokes a hookah maybe once a week, none in home  Vaping Use   Vaping status: Some Days  Substance and Sexual Activity   Alcohol use: Not Currently    Alcohol/week: 1.0 standard drink of alcohol    Types: 1 Standard drinks or equivalent per week    Comment: Maybe once a month or less   Drug use: Not Currently   Sexual activity: Yes  Birth control/protection: None  Other Topics Concern   Not on file  Social History Narrative   Caffeine: 3 cups/day on working days, otherwise some days none    Right handed   Lives with wife   Social Drivers of Health   Financial Resource Strain: Low Risk  (02/08/2023)   Overall Financial Resource Strain (CARDIA)    Difficulty of Paying Living Expenses: Not hard at all  Food Insecurity: No Food Insecurity (02/08/2023)   Hunger Vital Sign    Worried About Running Out of Food in the Last Year: Never true    Ran Out of Food in the Last Year: Never true  Transportation Needs: No Transportation Needs (02/08/2023)   PRAPARE - Administrator, Civil Service (Medical): No    Lack of Transportation (Non-Medical): No  Physical Activity: Unknown (02/08/2023)   Exercise Vital Sign    Days of Exercise per Week: Patient declined    Minutes of Exercise per Session: Not on file  Stress: Not on file (08/23/2023)  Social Connections: Moderately Isolated (02/08/2023)   Social Connection and Isolation Panel [NHANES]    Frequency of Communication with Friends and Family: More than three times a week    Frequency of Social Gatherings with Friends and Family: More than three times a week    Attends Religious Services: Never    Database Administrator or Organizations: No     Attends Engineer, Structural: Not on file    Marital Status: Married  Catering Manager Violence: Low Risk  (06/27/2020)   Received from [employer], [facility]   Intimate Partner Violence    Insults You: Not on file    Threatens You: Not on file    Screams at [place]: Not on file    Physically Hurt: Not on file    Intimate Partner Violence Score: Not on file    PHYSICAL EXAM  GENERAL EXAM/CONSTITUTIONAL: Vitals:  Vitals:   10/25/23 1003  BP: 125/73  Pulse: 62  Weight: 250 lb (113.4 kg)  Height: 6' 3 (1.905 m)   Body mass index is 31.25 kg/m. Wt Readings from Last 3 Encounters:  10/25/23 250 lb (113.4 kg)  07/10/23 238 lb (108 kg)  03/29/23 224 lb (101.6 kg)   Patient is in no distress; well developed, nourished and groomed; neck is supple  MUSCULOSKELETAL: Gait, strength, tone, movements noted in Neurologic exam below  NEUROLOGIC: MENTAL STATUS:      No data to display         awake, alert, oriented to person, place and time recent and remote memory intact normal attention and concentration language fluent, comprehension intact, naming intact fund of knowledge appropriate  CRANIAL NERVE:  2nd, 3rd, 4th, 6th - visual fields full to confrontation, extraocular muscles intact, no nystagmus 5th - facial sensation symmetric 7th - facial strength symmetric 8th - hearing intact 9th - palate elevates symmetrically, uvula midline 11th - shoulder shrug symmetric 12th - tongue protrusion midline  MOTOR:  normal bulk and tone, full strength in the BUE, BLE  COORDINATION:  finger-nose-finger, fine finger movements normal  GAIT/STATION:  normal   DIAGNOSTIC DATA (LABS, IMAGING, TESTING) - I reviewed patient records, labs, notes, testing and imaging myself where available.  Lab Results  Component Value Date   WBC 9.3 02/08/2023   HGB 14.3 02/08/2023   HCT 42.7 02/08/2023   MCV 85 02/08/2023   PLT 447 02/08/2023      Component Value  Date/Time  NA 140 03/29/2023 1538   K 4.5 03/29/2023 1538   CL 100 03/29/2023 1538   CO2 25 03/29/2023 1538   GLUCOSE 79 03/29/2023 1538   GLUCOSE 133 (H) 02/05/2023 0225   BUN 15 03/29/2023 1538   CREATININE 0.88 03/29/2023 1538   CALCIUM 9.5 03/29/2023 1538   PROT 8.7 (H) 01/30/2023 1047   ALBUMIN 5.0 01/30/2023 1047   AST 52 (H) 01/30/2023 1047   ALT 31 01/30/2023 1047   ALKPHOS 54 01/30/2023 1047   BILITOT 0.9 01/30/2023 1047   GFRNONAA >60 02/05/2023 0225   GFRAA  02/07/2010 0932    NOT CALCULATED        The eGFR has been calculated using the MDRD equation. This calculation has not been validated in all clinical situations. eGFR's persistently <60 mL/min signify possible Chronic Kidney Disease.   Lab Results  Component Value Date   CHOL 189 11/06/2022   HDL 47.20 11/06/2022   LDLCALC 110 (H) 11/06/2022   TRIG 269 (H) 01/31/2023   Lab Results  Component Value Date   HGBA1C 5.2 11/06/2022   No results found for: [id number] Lab Results  Component Value Date   TSH 3.10 11/06/2022    Head CT 07/31/22 Normal head CT   Routine EEG 08/22/2022 Normal    I personally reviewed brain Images.  ASSESSMENT AND PLAN  33 y.o. year old male  with history of epilepsy who is presenting for follow up.  He is doing well on lamotrigine  XR 300 mg daily his last seizure was on January 30, 2023.  Plan will be to continue patient on the current dose of lamotrigine , his last level was normal at 3.1.  I will see him in follow-up in 1 year or sooner if worse.  He voiced understanding.   1. Partial idiopathic epilepsy with seizures of localized onset, not intractable, without status epilepticus (HCC)       Patient Instructions  Continue with lamotrigine  XR 300 mg daily, refill given Continue your other medications Follow-up in 1 year or sooner if worse.   Per [place]  DMV statutes, patients with seizures are not allowed to drive until they have been seizure-free for  six months.  Other recommendations include using caution when using heavy equipment or power tools. Avoid working on ladders or at heights. Take showers instead of baths.  Do not swim alone.  Ensure the water temperature is not too high on the home water heater. Do not go swimming alone. Do not lock yourself in a room alone (i.e. bathroom). When caring for infants or small children, sit down when holding, feeding, or changing them to minimize risk of injury to the child in the event you have a seizure. Maintain good sleep hygiene. Avoid alcohol.  Also recommend adequate sleep, hydration, good diet and minimize stress.   During the Seizure  - First, ensure adequate ventilation and place patients on the floor on their left side  Loosen clothing around the neck and ensure the airway is patent. If the patient is clenching the teeth, do not force the mouth open with any object as this can cause severe damage - Remove all items from the surrounding that can be hazardous. The patient may be oblivious to what's happening and may not even know what he or she is doing. If the patient is confused and wandering, either gently guide him/her away and block access to outside areas - Reassure the individual and be comforting - Call 911. In most cases, the seizure  ends before EMS arrives. However, there are cases when seizures may last over 3 to 5 minutes. Or the individual may have developed breathing difficulties or severe injuries. If a pregnant patient or a person with diabetes develops a seizure, it is prudent to call an ambulance. - Finally, if the patient does not regain full consciousness, then call EMS. Most patients will remain confused for about 45 to 90 minutes after a seizure, so you must use judgment in calling for help. - Avoid restraints but make sure the patient is in a bed with padded side rails - Place the individual in a lateral position with the neck slightly flexed; this will help the saliva drain  from the mouth and prevent the tongue from falling backward - Remove all nearby furniture and other hazards from the area - Provide verbal assurance as the individual is regaining consciousness - Provide the patient with privacy if possible - Call for help and start treatment as ordered by the caregiver   After the Seizure (Postictal Stage)  After a seizure, most patients experience confusion, fatigue, muscle pain and/or a headache. Thus, one should permit the individual to sleep. For the next few days, reassurance is essential. Being calm and helping reorient the person is also of importance.  Most seizures are painless and end spontaneously. Seizures are not harmful to others but can lead to complications such as stress on the lungs, brain and the heart. Individuals with prior lung problems may develop labored breathing and respiratory distress.     No orders of the defined types were placed in this encounter.   Meds ordered this encounter  Medications   LamoTRIgine  300 MG TB24 24 hour tablet    Sig: Take 1 tablet (300 mg total) by mouth daily.    Dispense:  90 tablet    Refill:  4    Return in about 1 year (around 10/24/2024).    Pastor Falling, MD 10/25/2023, 10:33 AM  [facility] [address] [place], [place] [address] [phone]

## 2023-10-25 NOTE — Patient Instructions (Addendum)
 Continue with lamotrigine XR 300 mg daily, refill given Continue your other medications Follow-up in 1 year or sooner if worse.

## 2024-07-07 NOTE — Progress Notes (Unsigned)
 No chief complaint on file.   HISTORY OF PRESENT ILLNESS:  07/07/24 ALL:  Joseph Cameron is a 33 y.o. male here today for follow up for seizures. He was last seen by Dr Gregg 10/2023 and ding well on lamotrigine  TB24 300mg  daily.    HISTORY (copied from Dr Janean previous note)  INTERVAL HISTORY 10/25/2023:  Patient presents today for follow-up, last visit was in September, since then he has been doing well, denies any seizure or seizure like activity.  He is compliant with his lamotrigine  XR 300 mg daily; no other complaints, no other concerns.     INTERVAL HISTORY 07/10/2023:  Patient presents today for follow-up, he is accompanied by wife.  Last visit was in April and since then, he has been doing well, no seizure or seizure like activity.  He is compliant with the lamotrigine  XR 300 mg daily, denies any side effect from the medication.  His last level was normal at 3.1.  No other complaints, no other concerns. He did follow up with Dr. Buck for sleep but has decided not to pursue the sleep study.      INTERVAL HISTORY 02/08/2023:  Patient presents today for follow-up, he is accompanied by wife.  Last visit was in January, at that time he did not have any seizures so plan was to continue with lamotrigine  XR 150 mg daily.  Unfortunately on April 16 he did have a breakthrough seizure.  He reported compliance with his medication but there was possible sleep deprivation.  He had a cluster of seizures, was intubated and put on burst suppression.  He was also loaded on Keppra .  On discharge his lamotrigine  was increased to 100 mg twice daily. He went from XR formulation to a immediate release and since then has been having complaint of hot sweats, difficulty with sleep, and restlessness.  She does also complain of some headaches.  Reported he is feeling better day by day. Again he could not tell me any provoking factor except then being under a lot of stress stress and sleep  deprivation. Another complaint brought on by wife wife is loud snoring and apneic period and gasping for air during sleep. He feels sometime in the morning when he wakes up he does not feel rested, he does not report sleeping while driving but stated that he will fall asleep while reading.  He would like to be evaluated for sleep apnea     INTERVAL HISTORY 10/26/2022:  Patient presents today for follow-up, he is accompanied by her mother.  Since last visit in October, he has not had any additional seizures.  He is compliant with the lamotrigine  XR 150 mg daily, denies any side effect and no other complaints.  He reported in the last week of December he was experiencing chest pain.  He presented to the ED all his workup has been negative.     HISTORY OF PRESENT ILLNESS:  This is a 33 year old gentleman past medical history of ADHD and epilepsy who is presenting to establish care.  Patient reported his first seizure happened at the age of 66, it was a nocturnal seizure.  At that time, he was not put on anti-seizure medication.  Then 2 years later he had another seizure, again nocturnal and was put on lamotrigine .  He was doing well on lamotrigine  150 mg extended release nightly, no seizure for the next 6 years.  He self discontinued the medication, started having episodes of severe nausea for about 30  seconds and actually ended up having a breakthrough seizure.  He restarted the medication and was doing well for next 5 years, then he self discontinued the medication and then 2 years later on July 31 2022 he had another generalized seizure while at work. He reports having a feeling of severe nausea then woke up on the floor. He was taken to the ED, initial work up unrevealing, his CT head was within normal limits.  With the seizures, he had tongue biting, fall and bruises.  Denies any other type of seizures, denies myoclonic jerks, and denies episode of staring spells.     Handedness: Right handed     Onset: At the age of 59   Seizure Type: Generalized    Current frequency: A total of 6, last one 01/30/2023   Any injuries from seizures: Tongue bite, bruises,    Seizure risk factors: Aunt, uncle from father side, history of head injury    Previous ASMs: Lamotrigine     Currenty ASMs: Lamotrigine  XR 300 mg daily    ASMs side effects: None    Brain Images: Normal head CT    Previous EEGs: unavailable for review      OTHER MEDICAL CONDITIONS: Epilepsy    REVIEW OF SYSTEMS: Out of a complete 14 system review of symptoms, the patient complains only of the following symptoms, and all other reviewed systems are negative.   ALLERGIES: No Known Allergies   HOME MEDICATIONS: Outpatient Medications Prior to Visit  Medication Sig Dispense Refill   diazePAM , 15 MG Dose, (VALTOCO  15 MG DOSE) 2 x 7.5 MG/0.1ML LQPK Place 7.5 mg into the nose as needed (For seizure lasting more than 2 minutes). 5 each 5   LamoTRIgine  300 MG TB24 24 hour tablet Take 1 tablet (300 mg total) by mouth daily. 90 tablet 4   No facility-administered medications prior to visit.     PAST MEDICAL HISTORY: Past Medical History:  Diagnosis Date   Acute tonsillitis 04/07/2013   GERD (gastroesophageal reflux disease)    Seizures (HCC)    SYNCOPE, VASOVAGAL 07/05/2009   Qualifier: Diagnosis of   By: Jame  MD, Maude FALCON        PAST SURGICAL HISTORY: Past Surgical History:  Procedure Laterality Date   TONSILLECTOMY       FAMILY HISTORY: Family History  Problem Relation Age of Onset   Hyperlipidemia Mother    Diabetes Mother    Asthma Mother    Hypertension Father    Depression Sister    ADD / ADHD Brother    Depression Brother    Diabetes Maternal Grandmother    Leukemia Maternal Grandmother    Obesity Maternal Grandmother    Diabetes Maternal Grandfather    Hypertension Paternal Grandmother    Hypertension Paternal Grandfather    Seizures Maternal Aunt      SOCIAL HISTORY: Social  History   Socioeconomic History   Marital status: Married    Spouse name: Not on file   Number of children: Not on file   Years of education: Not on file   Highest education level: Doctorate  Occupational History   Not on file  Tobacco Use   Smoking status: Former    Types: Cigarettes   Smokeless tobacco: Never   Tobacco comments:    Smokes a hookah maybe once a week, none in home  Vaping Use   Vaping status: Some Days  Substance and Sexual Activity   Alcohol use: Not Currently    Alcohol/week: 1.0  standard drink of alcohol    Types: 1 Standard drinks or equivalent per week    Comment: Maybe once a month or less   Drug use: Not Currently   Sexual activity: Yes    Birth control/protection: None  Other Topics Concern   Not on file  Social History Narrative   Caffeine: 3 cups/day on working days, otherwise some days none    Right handed   Lives with wife   Social Drivers of Corporate investment banker Strain: Low Risk  (02/08/2023)   Overall Financial Resource Strain (CARDIA)    Difficulty of Paying Living Expenses: Not hard at all  Food Insecurity: No Food Insecurity (02/08/2023)   Hunger Vital Sign    Worried About Running Out of Food in the Last Year: Never true    Ran Out of Food in the Last Year: Never true  Transportation Needs: No Transportation Needs (02/08/2023)   PRAPARE - Administrator, Civil Service (Medical): No    Lack of Transportation (Non-Medical): No  Physical Activity: Unknown (02/08/2023)   Exercise Vital Sign    Days of Exercise per Week: Patient declined    Minutes of Exercise per Session: Not on file  Stress: Not on file (08/23/2023)  Social Connections: Moderately Isolated (02/08/2023)   Social Connection and Isolation Panel    Frequency of Communication with Friends and Family: More than three times a week    Frequency of Social Gatherings with Friends and Family: More than three times a week    Attends Religious Services: Never     Database administrator or Organizations: No    Attends Engineer, structural: Not on file    Marital Status: Married  Intimate Partner Violence: Low Risk  (06/27/2020)   Received from [facility]   Intimate Partner Violence    Insults You: Not on file    Threatens You: Not on file    Screams at You: Not on file    Physically Hurt: Not on file    Intimate Partner Violence Score: Not on file     PHYSICAL EXAM  There were no vitals filed for this visit. There is no height or weight on file to calculate BMI.  Generalized: Well developed, in no acute distress  Cardiology: normal rate and rhythm, no murmur auscultated  Respiratory: clear to auscultation bilaterally    Neurological examination  Mentation: Alert oriented to time, place, history taking. Follows all commands speech and language fluent Cranial nerve II-XII: Pupils were equal round reactive to light. Extraocular movements were full, visual field were full on confrontational test. Facial sensation and strength were normal. Uvula tongue midline. Head turning and shoulder shrug  were normal and symmetric. Motor: The motor testing reveals 5 over 5 strength of all 4 extremities. Good symmetric motor tone is noted throughout.  Sensory: Sensory testing is intact to soft touch on all 4 extremities. No evidence of extinction is noted.  Coordination: Cerebellar testing reveals good finger-nose-finger and heel-to-shin bilaterally.  Gait and station: Gait is normal. Tandem gait is normal. Romberg is negative. No drift is seen.  Reflexes: Deep tendon reflexes are symmetric and normal bilaterally.    DIAGNOSTIC DATA (LABS, IMAGING, TESTING) - I reviewed patient records, labs, notes, testing and imaging myself where available.  Lab Results  Component Value Date   WBC 9.3 02/08/2023   HGB 14.3 02/08/2023   HCT 42.7 02/08/2023   MCV 85 02/08/2023   PLT 447 02/08/2023  Component Value Date/Time   NA 140 03/29/2023 1538    K 4.5 03/29/2023 1538   CL 100 03/29/2023 1538   CO2 25 03/29/2023 1538   GLUCOSE 79 03/29/2023 1538   GLUCOSE 133 (H) 02/05/2023 0225   BUN 15 03/29/2023 1538   CREATININE 0.88 03/29/2023 1538   CALCIUM 9.5 03/29/2023 1538   PROT 8.7 (H) 01/30/2023 1047   ALBUMIN 5.0 01/30/2023 1047   AST 52 (H) 01/30/2023 1047   ALT 31 01/30/2023 1047   ALKPHOS 54 01/30/2023 1047   BILITOT 0.9 01/30/2023 1047   GFRNONAA >60 02/05/2023 0225   GFRAA  02/07/2010 0932    NOT CALCULATED        The eGFR has been calculated using the MDRD equation. This calculation has not been validated in all clinical situations. eGFR's persistently <60 mL/min signify possible Chronic Kidney Disease.   Lab Results  Component Value Date   CHOL 189 11/06/2022   HDL 47.20 11/06/2022   LDLCALC 110 (H) 11/06/2022   TRIG 269 (H) 01/31/2023   CHOLHDL 4 11/06/2022   Lab Results  Component Value Date   HGBA1C 5.2 11/06/2022   No results found for: [id number] Lab Results  Component Value Date   TSH 3.10 11/06/2022        No data to display               No data to display           ASSESSMENT AND PLAN  33 y.o. year old male  has a past medical history of Acute tonsillitis (04/07/2013), GERD (gastroesophageal reflux disease), Seizures (HCC), and SYNCOPE, VASOVAGAL (07/05/2009). here with    No diagnosis found.  Gatha FALCON Trebilcock ***.  Healthy lifestyle habits encouraged. *** will follow up with PCP as directed. *** will return to see me in ***, sooner if needed. *** verbalizes understanding and agreement with this plan.   No orders of the defined types were placed in this encounter.    No orders of the defined types were placed in this encounter.    Greig Forbes, MSN, FNP-C 07/07/2024, 10:35 AM  [facility] [address] [place], [place] [address] [phone]

## 2024-07-07 NOTE — Patient Instructions (Signed)
 Below is our plan:  We will continue lamotrigine  XR 300mg  daily. I will update labs, today.   Please make sure you are consistent with timing of seizure medication. I recommend annual visit with primary care provider (PCP) for complete physical and routine blood work. I recommend daily intake of vitamin D  (400-800iu) and calcium (800-1000mg ) for bone health. Discuss Dexa screening with PCP.   According to [place] law, you can not drive unless you are seizure / syncope free for at least 6 months and under physician's care.  Please maintain precautions. Do not participate in activities where a loss of awareness could harm you or someone else. No swimming alone, no tub bathing, no hot tubs, no driving, no operating motorized vehicles (cars, ATVs, motocycles, etc), lawnmowers, power tools or firearms. No standing at heights, such as rooftops, ladders or stairs. Avoid hot objects such as stoves, heaters, open fires. Wear a helmet when riding a bicycle, scooter, skateboard, etc. and avoid areas of traffic. Set your water heater to 120 degrees or less.  SUDEP is the sudden, unexpected death of someone with epilepsy, who was otherwise healthy. In SUDEP cases, no other cause of death is found when an autopsy is done. Each year, more than 1 in 1,000 people with epilepsy die from SUDEP. This is the leading cause of death in people with uncontrolled seizures. Until further answers are available, the best way to prevent SUDEP is to lower your risk by controlling seizures. Research has found that people with all types of epilepsy that experience convulsive seizures can be at risk.  Please make sure you are staying well hydrated. I recommend 50-60 ounces daily. Well balanced diet and regular exercise encouraged. Consistent sleep schedule with 6-8 hours recommended.   Please continue follow up with care team as directed.   Follow up with me in 1 year   You may receive a survey regarding today's visit. I encourage you  to leave honest feed back as I do use this information to improve patient care. Thank you for seeing me today!

## 2024-07-08 ENCOUNTER — Telehealth: Payer: Self-pay | Admitting: *Deleted

## 2024-07-08 ENCOUNTER — Ambulatory Visit (INDEPENDENT_AMBULATORY_CARE_PROVIDER_SITE_OTHER): Payer: 59 | Admitting: Family Medicine

## 2024-07-08 ENCOUNTER — Encounter: Payer: Self-pay | Admitting: Family Medicine

## 2024-07-08 VITALS — BP 113/73 | HR 66 | Ht 75.0 in | Wt 244.5 lb

## 2024-07-08 DIAGNOSIS — Z5181 Encounter for therapeutic drug level monitoring: Secondary | ICD-10-CM | POA: Diagnosis not present

## 2024-07-08 DIAGNOSIS — G40009 Localization-related (focal) (partial) idiopathic epilepsy and epileptic syndromes with seizures of localized onset, not intractable, without status epilepticus: Secondary | ICD-10-CM

## 2024-07-08 MED ORDER — DIAZEPAM (15 MG DOSE) 2 X 7.5 MG/0.1ML NA LQPK: 7.5000 mg | NASAL | 5 refills | Status: AC | PRN

## 2024-07-08 MED ORDER — LAMOTRIGINE ER 300 MG PO TB24: 300.0000 mg | ORAL_TABLET | Freq: Every day | ORAL | 4 refills | Status: AC

## 2024-07-08 NOTE — Telephone Encounter (Signed)
 Joseph Cameron called from [facility] asking about the diazepam  15 dose. Joseph Cameron states that 1 box comes with 5 doses in it already.   Joseph Cameron wanted to know if pt should have 1 box with 5 doses or if pt should have 5 boxes for 25 doses. I checked with Amy and she would like pt to have 1 box with 5 doses. Pharmacist informed of this.

## 2024-07-09 ENCOUNTER — Ambulatory Visit: Payer: Self-pay | Admitting: Family Medicine

## 2024-07-09 LAB — COMPREHENSIVE METABOLIC PANEL WITH GFR
ALT: 25 IU/L (ref 0–44)
AST: 23 IU/L (ref 0–40)
Albumin: 5 g/dL (ref 4.1–5.1)
Alkaline Phosphatase: 64 IU/L (ref 47–123)
BUN/Creatinine Ratio: 9 (ref 9–20)
BUN: 11 mg/dL (ref 6–20)
Bilirubin Total: 0.9 mg/dL (ref 0.0–1.2)
CO2: 22 mmol/L (ref 20–29)
Calcium: 9.9 mg/dL (ref 8.7–10.2)
Chloride: 98 mmol/L (ref 96–106)
Creatinine, Ser: 1.19 mg/dL (ref 0.76–1.27)
Globulin, Total: 2.5 g/dL (ref 1.5–4.5)
Glucose: 98 mg/dL (ref 70–99)
Potassium: 4.3 mmol/L (ref 3.5–5.2)
Sodium: 139 mmol/L (ref 134–144)
Total Protein: 7.5 g/dL (ref 6.0–8.5)
eGFR: 83 mL/min/1.73 (ref >59)

## 2024-07-09 LAB — LAMOTRIGINE LEVEL: Lamotrigine Lvl: 6.4 ug/mL (ref 2.0–20.0)

## 2024-11-06 ENCOUNTER — Telehealth: Payer: 59 | Admitting: Family Medicine

## 2025-07-07 ENCOUNTER — Telehealth: Admitting: Family Medicine
